# Patient Record
Sex: Male | Born: 2000 | Race: White | Hispanic: No | Marital: Single | Smoking: Never smoker
Health system: Southern US, Community
[De-identification: ages and names within clinical notes are randomized; demographics above are authoritative.]

## PROBLEM LIST (undated history)

## (undated) DIAGNOSIS — J45909 Unspecified asthma, uncomplicated: Secondary | ICD-10-CM

## (undated) DIAGNOSIS — F419 Anxiety disorder, unspecified: Secondary | ICD-10-CM

## (undated) HISTORY — DX: Unspecified asthma, uncomplicated: J45.909

## (undated) HISTORY — PX: TONSILECTOMY, ADENOIDECTOMY, BILATERAL MYRINGOTOMY AND TUBES: SHX2538

## (undated) HISTORY — DX: Anxiety disorder, unspecified: F41.9

---
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
## 2021-01-11 ENCOUNTER — Ambulatory Visit (INDEPENDENT_AMBULATORY_CARE_PROVIDER_SITE_OTHER): Payer: BC Managed Care – PPO | Admitting: Psychology

## 2021-01-11 DIAGNOSIS — F419 Anxiety disorder, unspecified: Secondary | ICD-10-CM | POA: Diagnosis not present

## 2021-01-11 DIAGNOSIS — F4310 Post-traumatic stress disorder, unspecified: Secondary | ICD-10-CM

## 2021-01-11 DIAGNOSIS — F429 Obsessive-compulsive disorder, unspecified: Secondary | ICD-10-CM

## 2021-01-11 DIAGNOSIS — F331 Major depressive disorder, recurrent, moderate: Secondary | ICD-10-CM

## 2021-01-24 ENCOUNTER — Ambulatory Visit (INDEPENDENT_AMBULATORY_CARE_PROVIDER_SITE_OTHER): Payer: BC Managed Care – PPO | Admitting: Psychology

## 2021-01-24 DIAGNOSIS — F331 Major depressive disorder, recurrent, moderate: Secondary | ICD-10-CM | POA: Diagnosis not present

## 2021-01-24 DIAGNOSIS — F419 Anxiety disorder, unspecified: Secondary | ICD-10-CM

## 2021-01-25 DIAGNOSIS — S43004A Unspecified dislocation of right shoulder joint, initial encounter: Secondary | ICD-10-CM | POA: Diagnosis not present

## 2021-01-25 DIAGNOSIS — G8918 Other acute postprocedural pain: Secondary | ICD-10-CM | POA: Diagnosis not present

## 2021-01-25 DIAGNOSIS — S43021A Posterior subluxation of right humerus, initial encounter: Secondary | ICD-10-CM | POA: Diagnosis not present

## 2021-01-25 DIAGNOSIS — M24011 Loose body in right shoulder: Secondary | ICD-10-CM | POA: Diagnosis not present

## 2021-01-25 DIAGNOSIS — Y9365 Activity, lacrosse and field hockey: Secondary | ICD-10-CM | POA: Diagnosis not present

## 2021-01-25 DIAGNOSIS — X58XXXA Exposure to other specified factors, initial encounter: Secondary | ICD-10-CM | POA: Diagnosis not present

## 2021-01-25 DIAGNOSIS — M25311 Other instability, right shoulder: Secondary | ICD-10-CM | POA: Diagnosis not present

## 2021-02-02 DIAGNOSIS — M24011 Loose body in right shoulder: Secondary | ICD-10-CM | POA: Diagnosis not present

## 2021-02-15 ENCOUNTER — Ambulatory Visit (INDEPENDENT_AMBULATORY_CARE_PROVIDER_SITE_OTHER): Payer: BC Managed Care – PPO | Admitting: Psychology

## 2021-02-15 DIAGNOSIS — F331 Major depressive disorder, recurrent, moderate: Secondary | ICD-10-CM | POA: Diagnosis not present

## 2021-02-15 DIAGNOSIS — F419 Anxiety disorder, unspecified: Secondary | ICD-10-CM

## 2021-02-21 ENCOUNTER — Ambulatory Visit (INDEPENDENT_AMBULATORY_CARE_PROVIDER_SITE_OTHER): Payer: BC Managed Care – PPO | Admitting: Psychology

## 2021-02-21 DIAGNOSIS — F331 Major depressive disorder, recurrent, moderate: Secondary | ICD-10-CM

## 2021-02-21 DIAGNOSIS — F419 Anxiety disorder, unspecified: Secondary | ICD-10-CM | POA: Diagnosis not present

## 2021-02-23 DIAGNOSIS — M25611 Stiffness of right shoulder, not elsewhere classified: Secondary | ICD-10-CM | POA: Diagnosis not present

## 2021-02-23 DIAGNOSIS — S43004D Unspecified dislocation of right shoulder joint, subsequent encounter: Secondary | ICD-10-CM | POA: Diagnosis not present

## 2021-02-23 DIAGNOSIS — M25511 Pain in right shoulder: Secondary | ICD-10-CM | POA: Diagnosis not present

## 2021-02-23 DIAGNOSIS — M6281 Muscle weakness (generalized): Secondary | ICD-10-CM | POA: Diagnosis not present

## 2021-03-01 ENCOUNTER — Ambulatory Visit (INDEPENDENT_AMBULATORY_CARE_PROVIDER_SITE_OTHER): Payer: BC Managed Care – PPO | Admitting: Psychology

## 2021-03-01 DIAGNOSIS — M25611 Stiffness of right shoulder, not elsewhere classified: Secondary | ICD-10-CM | POA: Diagnosis not present

## 2021-03-01 DIAGNOSIS — F419 Anxiety disorder, unspecified: Secondary | ICD-10-CM

## 2021-03-01 DIAGNOSIS — F331 Major depressive disorder, recurrent, moderate: Secondary | ICD-10-CM | POA: Diagnosis not present

## 2021-03-01 DIAGNOSIS — M25511 Pain in right shoulder: Secondary | ICD-10-CM | POA: Diagnosis not present

## 2021-03-01 DIAGNOSIS — M6281 Muscle weakness (generalized): Secondary | ICD-10-CM | POA: Diagnosis not present

## 2021-03-01 DIAGNOSIS — S43004D Unspecified dislocation of right shoulder joint, subsequent encounter: Secondary | ICD-10-CM | POA: Diagnosis not present

## 2021-03-07 ENCOUNTER — Ambulatory Visit (INDEPENDENT_AMBULATORY_CARE_PROVIDER_SITE_OTHER): Payer: BC Managed Care – PPO | Admitting: Psychology

## 2021-03-07 DIAGNOSIS — F419 Anxiety disorder, unspecified: Secondary | ICD-10-CM

## 2021-03-07 DIAGNOSIS — F331 Major depressive disorder, recurrent, moderate: Secondary | ICD-10-CM

## 2021-03-08 DIAGNOSIS — M25611 Stiffness of right shoulder, not elsewhere classified: Secondary | ICD-10-CM | POA: Diagnosis not present

## 2021-03-08 DIAGNOSIS — S43004D Unspecified dislocation of right shoulder joint, subsequent encounter: Secondary | ICD-10-CM | POA: Diagnosis not present

## 2021-03-08 DIAGNOSIS — M25511 Pain in right shoulder: Secondary | ICD-10-CM | POA: Diagnosis not present

## 2021-03-08 DIAGNOSIS — M6281 Muscle weakness (generalized): Secondary | ICD-10-CM | POA: Diagnosis not present

## 2021-03-14 DIAGNOSIS — M6281 Muscle weakness (generalized): Secondary | ICD-10-CM | POA: Diagnosis not present

## 2021-03-14 DIAGNOSIS — M25611 Stiffness of right shoulder, not elsewhere classified: Secondary | ICD-10-CM | POA: Diagnosis not present

## 2021-03-14 DIAGNOSIS — S43004D Unspecified dislocation of right shoulder joint, subsequent encounter: Secondary | ICD-10-CM | POA: Diagnosis not present

## 2021-03-14 DIAGNOSIS — M25511 Pain in right shoulder: Secondary | ICD-10-CM | POA: Diagnosis not present

## 2021-03-16 ENCOUNTER — Ambulatory Visit: Payer: BC Managed Care – PPO | Admitting: Psychology

## 2021-03-20 ENCOUNTER — Ambulatory Visit: Payer: BC Managed Care – PPO | Admitting: Psychology

## 2021-03-21 DIAGNOSIS — M25511 Pain in right shoulder: Secondary | ICD-10-CM | POA: Diagnosis not present

## 2021-03-21 DIAGNOSIS — M6281 Muscle weakness (generalized): Secondary | ICD-10-CM | POA: Diagnosis not present

## 2021-03-21 DIAGNOSIS — M25611 Stiffness of right shoulder, not elsewhere classified: Secondary | ICD-10-CM | POA: Diagnosis not present

## 2021-03-21 DIAGNOSIS — S43004D Unspecified dislocation of right shoulder joint, subsequent encounter: Secondary | ICD-10-CM | POA: Diagnosis not present

## 2021-03-24 DIAGNOSIS — M25611 Stiffness of right shoulder, not elsewhere classified: Secondary | ICD-10-CM | POA: Diagnosis not present

## 2021-03-24 DIAGNOSIS — S43004D Unspecified dislocation of right shoulder joint, subsequent encounter: Secondary | ICD-10-CM | POA: Diagnosis not present

## 2021-03-24 DIAGNOSIS — M25511 Pain in right shoulder: Secondary | ICD-10-CM | POA: Diagnosis not present

## 2021-03-24 DIAGNOSIS — M6281 Muscle weakness (generalized): Secondary | ICD-10-CM | POA: Diagnosis not present

## 2021-03-29 DIAGNOSIS — M25511 Pain in right shoulder: Secondary | ICD-10-CM | POA: Diagnosis not present

## 2021-03-29 DIAGNOSIS — M25611 Stiffness of right shoulder, not elsewhere classified: Secondary | ICD-10-CM | POA: Diagnosis not present

## 2021-03-29 DIAGNOSIS — S43004D Unspecified dislocation of right shoulder joint, subsequent encounter: Secondary | ICD-10-CM | POA: Diagnosis not present

## 2021-03-29 DIAGNOSIS — M6281 Muscle weakness (generalized): Secondary | ICD-10-CM | POA: Diagnosis not present

## 2021-03-31 DIAGNOSIS — M6281 Muscle weakness (generalized): Secondary | ICD-10-CM | POA: Diagnosis not present

## 2021-03-31 DIAGNOSIS — S43004D Unspecified dislocation of right shoulder joint, subsequent encounter: Secondary | ICD-10-CM | POA: Diagnosis not present

## 2021-03-31 DIAGNOSIS — M25511 Pain in right shoulder: Secondary | ICD-10-CM | POA: Diagnosis not present

## 2021-03-31 DIAGNOSIS — M25611 Stiffness of right shoulder, not elsewhere classified: Secondary | ICD-10-CM | POA: Diagnosis not present

## 2021-04-04 DIAGNOSIS — S43004D Unspecified dislocation of right shoulder joint, subsequent encounter: Secondary | ICD-10-CM | POA: Diagnosis not present

## 2021-04-04 DIAGNOSIS — M6281 Muscle weakness (generalized): Secondary | ICD-10-CM | POA: Diagnosis not present

## 2021-04-04 DIAGNOSIS — M25511 Pain in right shoulder: Secondary | ICD-10-CM | POA: Diagnosis not present

## 2021-04-04 DIAGNOSIS — M25611 Stiffness of right shoulder, not elsewhere classified: Secondary | ICD-10-CM | POA: Diagnosis not present

## 2021-04-07 DIAGNOSIS — M25611 Stiffness of right shoulder, not elsewhere classified: Secondary | ICD-10-CM | POA: Diagnosis not present

## 2021-04-07 DIAGNOSIS — S43004D Unspecified dislocation of right shoulder joint, subsequent encounter: Secondary | ICD-10-CM | POA: Diagnosis not present

## 2021-04-07 DIAGNOSIS — M25511 Pain in right shoulder: Secondary | ICD-10-CM | POA: Diagnosis not present

## 2021-04-07 DIAGNOSIS — M6281 Muscle weakness (generalized): Secondary | ICD-10-CM | POA: Diagnosis not present

## 2021-04-14 DIAGNOSIS — S43004D Unspecified dislocation of right shoulder joint, subsequent encounter: Secondary | ICD-10-CM | POA: Diagnosis not present

## 2021-04-14 DIAGNOSIS — M25511 Pain in right shoulder: Secondary | ICD-10-CM | POA: Diagnosis not present

## 2021-04-14 DIAGNOSIS — M6281 Muscle weakness (generalized): Secondary | ICD-10-CM | POA: Diagnosis not present

## 2021-04-14 DIAGNOSIS — M25611 Stiffness of right shoulder, not elsewhere classified: Secondary | ICD-10-CM | POA: Diagnosis not present

## 2021-04-18 DIAGNOSIS — M25511 Pain in right shoulder: Secondary | ICD-10-CM | POA: Diagnosis not present

## 2021-04-18 DIAGNOSIS — S43004D Unspecified dislocation of right shoulder joint, subsequent encounter: Secondary | ICD-10-CM | POA: Diagnosis not present

## 2021-04-18 DIAGNOSIS — M6281 Muscle weakness (generalized): Secondary | ICD-10-CM | POA: Diagnosis not present

## 2021-04-18 DIAGNOSIS — M25611 Stiffness of right shoulder, not elsewhere classified: Secondary | ICD-10-CM | POA: Diagnosis not present

## 2021-05-09 DIAGNOSIS — L700 Acne vulgaris: Secondary | ICD-10-CM | POA: Diagnosis not present

## 2021-05-17 ENCOUNTER — Ambulatory Visit (INDEPENDENT_AMBULATORY_CARE_PROVIDER_SITE_OTHER): Payer: BC Managed Care – PPO | Admitting: Psychology

## 2021-05-17 DIAGNOSIS — F419 Anxiety disorder, unspecified: Secondary | ICD-10-CM | POA: Diagnosis not present

## 2021-05-17 DIAGNOSIS — F331 Major depressive disorder, recurrent, moderate: Secondary | ICD-10-CM

## 2021-05-30 DIAGNOSIS — S43491A Other sprain of right shoulder joint, initial encounter: Secondary | ICD-10-CM | POA: Diagnosis not present

## 2021-06-06 ENCOUNTER — Ambulatory Visit (INDEPENDENT_AMBULATORY_CARE_PROVIDER_SITE_OTHER): Payer: BC Managed Care – PPO | Admitting: Psychology

## 2021-06-06 DIAGNOSIS — F419 Anxiety disorder, unspecified: Secondary | ICD-10-CM

## 2021-06-06 DIAGNOSIS — F331 Major depressive disorder, recurrent, moderate: Secondary | ICD-10-CM | POA: Diagnosis not present

## 2021-06-13 ENCOUNTER — Ambulatory Visit (INDEPENDENT_AMBULATORY_CARE_PROVIDER_SITE_OTHER): Payer: BC Managed Care – PPO | Admitting: Psychology

## 2021-06-13 DIAGNOSIS — F419 Anxiety disorder, unspecified: Secondary | ICD-10-CM | POA: Diagnosis not present

## 2021-06-13 DIAGNOSIS — F331 Major depressive disorder, recurrent, moderate: Secondary | ICD-10-CM | POA: Diagnosis not present

## 2021-06-20 ENCOUNTER — Ambulatory Visit: Payer: BC Managed Care – PPO | Admitting: Psychology

## 2021-06-28 ENCOUNTER — Ambulatory Visit: Payer: BC Managed Care – PPO | Admitting: Adult Health

## 2021-06-29 ENCOUNTER — Other Ambulatory Visit: Payer: Self-pay

## 2021-06-29 ENCOUNTER — Encounter: Payer: Self-pay | Admitting: Adult Health

## 2021-06-29 ENCOUNTER — Ambulatory Visit (INDEPENDENT_AMBULATORY_CARE_PROVIDER_SITE_OTHER): Payer: BC Managed Care – PPO | Admitting: Adult Health

## 2021-06-29 DIAGNOSIS — F422 Mixed obsessional thoughts and acts: Secondary | ICD-10-CM

## 2021-06-29 DIAGNOSIS — F4311 Post-traumatic stress disorder, acute: Secondary | ICD-10-CM

## 2021-06-29 DIAGNOSIS — F900 Attention-deficit hyperactivity disorder, predominantly inattentive type: Secondary | ICD-10-CM

## 2021-06-29 DIAGNOSIS — F411 Generalized anxiety disorder: Secondary | ICD-10-CM | POA: Diagnosis not present

## 2021-06-29 MED ORDER — BUPROPION HCL ER (XL) 150 MG PO TB24: 150.0000 mg | ORAL_TABLET | Freq: Every day | ORAL | 3 refills | Status: DC

## 2021-06-29 MED ORDER — SERTRALINE HCL 100 MG PO TABS: ORAL_TABLET | ORAL | 3 refills | Status: DC

## 2021-06-29 MED ORDER — CLONIDINE HCL 0.1 MG PO TABS: 0.1000 mg | ORAL_TABLET | Freq: Every day | ORAL | 3 refills | Status: DC

## 2021-06-29 NOTE — Progress Notes (Signed)
Johnny Hubbard [id number] [birth year] 20 y.o.  Subjective:   Patient ID:  Johnny Hubbard is a 20 y.o. (DOB July 05, 2001) male.  Chief Complaint: No chief complaint on file.   HPI Johnny Hubbard presents to the office today for follow-up of GAD, ADHD, PTSD, Obsessional thoughts and acts.  Describes mood today as "ok". Pleasant. Denies tearfulness. Mood symptoms - reports decreased depression, anxiety, and irritability. Stating "I think I'm doing good". Denies worry and rumination. Decreased obsessive thoughts. Feels like medications continue to work well. Seeing Dr. Dellia Cloud for therapy. Stable interest and motivation. Taking medications as prescribed. MRI scheduled for right shoulder. Energy levels stable. Active, has a regular exercise routine. Playing Lacrosse. Enjoys some usual interests and activities. Freshman at [employer]. Parents divorced. Staying at mothers full time and spends time with father. Has a brother at [facility]. Spending time with family. Appetite adequate. Weight stable. Sleeps well most nights. Averages 7 to 8 hours. Focus and concentration stable. Completing tasks. Managing aspects of household. Starting [employer] in January. Denies SI or HI.  Denies AH or VH.  Previous medication trials: Denies      Review of Systems:  Review of Systems  Musculoskeletal:  Negative for gait problem.  Neurological:  Negative for tremors.  Psychiatric/Behavioral:         Please refer to HPI   Medications: I have reviewed the patient's current medications.  Current Outpatient Medications  Medication Sig Dispense Refill   buPROPion (WELLBUTRIN XL) 150 MG 24 hr tablet Take 1 tablet (150 mg total) by mouth daily after breakfast. 90 tablet 3   cloNIDine (CATAPRES) 0.1 MG tablet Take 1 tablet (0.1 mg total) by mouth at bedtime. 90 tablet 3   penicillin v potassium (VEETID) 500 MG tablet Take 1 tablet (500 mg total) by mouth 2 (two) times daily. 20 tablet 0   sertraline  (ZOLOFT) 100 MG tablet Take one and 1/2 tablets daily. 135 tablet 3   No current facility-administered medications for this visit.    Medication Side Effects: None  Allergies: No Known Allergies  Past Medical History:  Diagnosis Date   Anxiety    Asthma     Past Medical History, Surgical history, Social history, and Family history were reviewed and updated as appropriate.   Please see review of systems for further details on the patient's review from today.   Objective:   Physical Exam:  There were no vitals taken for this visit.  Physical Exam Constitutional:      General: He is not in acute distress. Musculoskeletal:        General: No deformity.  Neurological:     Mental Status: He is alert and oriented to person, place, and time.     Coordination: Coordination normal.  Psychiatric:        Attention and Perception: Attention and perception normal. He does not perceive auditory or visual hallucinations.        Mood and Affect: Mood normal. Mood is not anxious or depressed. Affect is not labile, blunt, angry or inappropriate.        Speech: Speech normal.        Behavior: Behavior normal.        Thought Content: Thought content normal. Thought content is not paranoid or delusional. Thought content does not include homicidal or suicidal ideation. Thought content does not include homicidal or suicidal plan.        Cognition and Memory: Cognition and memory normal.  Judgment: Judgment normal.     Comments: Insight intact    Lab Review:  No results found for: NA, K, CL, CO2, GLUCOSE, BUN, CREATININE, CALCIUM, PROT, ALBUMIN, AST, ALT, ALKPHOS, BILITOT, GFRNONAA, GFRAA  No results found for: WBC, RBC, HGB, HCT, PLT, MCV, MCH, MCHC, RDW, LYMPHSABS, MONOABS, EOSABS, BASOSABS  No results found for: POCLITH, LITHIUM   No results found for: PHENYTOIN, PHENOBARB, VALPROATE, CBMZ   .res Assessment: Plan:     Plan:  PDMP reviewed  1. Zoloft 100 mg every morning  after breakfast  2. Wellbutrin 150 mg XL every morning after breakfast  3. Clonidine 0.1 mg every bedtime   Read and reviewed note with patient for accuracy.   RTC 6 months  Patient advised to contact office with any questions, adverse effects, or acute worsening in signs and symptoms.   Diagnoses and all orders for this visit:  Mixed obsessional thoughts and acts  Attention deficit hyperactivity disorder (ADHD), inattentive type, mild -     buPROPion (WELLBUTRIN XL) 150 MG 24 hr tablet; Take 1 tablet (150 mg total) by mouth daily after breakfast. -     cloNIDine (CATAPRES) 0.1 MG tablet; Take 1 tablet (0.1 mg total) by mouth at bedtime.  Generalized anxiety disorder -     cloNIDine (CATAPRES) 0.1 MG tablet; Take 1 tablet (0.1 mg total) by mouth at bedtime. -     sertraline (ZOLOFT) 100 MG tablet; Take one and 1/2 tablets daily.  Post-traumatic stress disorder, acute    Please see After Visit Summary for patient specific instructions.  Future Appointments  Date Time Provider Department Center  12/04/2021  8:40 AM Emmerie Battaglia, Thereasa Solo, NP CP-CP None    No orders of the defined types were placed in this encounter.   -------------------------------

## 2021-07-04 DIAGNOSIS — Z0000 Encounter for general adult medical examination without abnormal findings: Secondary | ICD-10-CM | POA: Diagnosis not present

## 2021-07-04 DIAGNOSIS — L709 Acne, unspecified: Secondary | ICD-10-CM | POA: Diagnosis not present

## 2021-09-19 ENCOUNTER — Ambulatory Visit (INDEPENDENT_AMBULATORY_CARE_PROVIDER_SITE_OTHER): Payer: BC Managed Care – PPO | Admitting: Psychology

## 2021-09-19 DIAGNOSIS — F411 Generalized anxiety disorder: Secondary | ICD-10-CM | POA: Diagnosis not present

## 2021-09-19 DIAGNOSIS — F422 Mixed obsessional thoughts and acts: Secondary | ICD-10-CM

## 2021-09-19 NOTE — Progress Notes (Signed)
Progress Note Start: 06/13/2021 09:38 AM End: 06/13/2021 10:23 AM Diagnosis F42 (Hoarding disorder) [n/a]  F43.21 (Adjustment Disorder, With depressed mood) [n/a]  Symptoms Demonstrates low energy. (Status: maintained) -- No Description Entered  Lacks interest in previously enjoyed activities. (Status: maintained) -- No Description Entered  Recognition that obsessive thoughts are a product of his/her own mind. (Status: maintained) -- No Description Entered  Recurrent and persistent ideas, thoughts, or impulses that are viewed as intrusive, senseless, and time-consuming, or that interfere with the client's daily routine, school performance, or social relationships. (Status: maintained) -- No Description Entered  Medication Status na  Safety unspecified If Suicidal or Homicidal State Action Taken: unspecified  Current Risk:  Medications Bupropion (Dosage: 150mg )  Clonidine (Dosage: .1mg )  Sertraline (Dosage: 100mg )  Objectives Related Problem: Elevate mood and show evidence of usual energy, activities, and socialization level. Description: Specify what in the past contributes to current sadness. Target Date: 2022-01-09 Frequency: Daily Modality: individual Progress: 80%  Related Problem: Elevate mood and show evidence of usual energy, activities, and socialization level. Description: Learn new ways to overcome depression through activity. Target Date: 2022-01-08 Frequency: Daily Modality: individual Progress: 70%  Related Problem: Elevate mood and show evidence of usual energy, activities, and socialization level. Description: Verbalize any history of suicide attempts and any current suicidal urges. Target Date: 2021-03-10 Frequency: Daily Modality: individual Progress: 100%  Related Problem: Elevate mood and show evidence of usual energy, activities, and socialization level. Description: Verbally identify, if possible, the source of depressed mood. Target Date:  2022-01-08 Frequency: Daily Modality: individual Progress: 80%  Related Problem: Elevate mood and show evidence of usual energy, activities, and socialization level. Description: Describe current and past experiences with depression, complete with its impact on functioning and attempts to resolve it. Target Date: 2022-01-09 Frequency: Daily Modality: individual Progress: 70%  Related Problem: Let go of key thoughts, beliefs, and past life events in order to maximize time free from obsessions and compulsions. Description: Identify support persons or resources that can help the client manage obsessions/compulsions. Target Date: 2022-01-08 Frequency: Daily Modality: individual Progress: 70%  Related Problem: Let go of key thoughts, beliefs, and past life events in order to maximize time free from obsessions and compulsions. Description: Describe the nature, history, and severity of obsessive thoughts and/or compulsive behavior. Target Date: 2022-01-09 Frequency: Daily Modality: individual Progress: 60%  Client Response full compliance  Service Location Location, [address] [place], [address]22[address] [place]  Service Code cpt [id number]  Facilitate problem solving  Review therapy homework  Session notes:  Dx. Depression and Anxiety. Also, OCD tendencies and some PTSD  Meds: Clonidine (.1 mg at bed time), Sertraline(100mg ) and Bupropion 150mg /day (Dr. [place]) Goals: Theophile states he is seeking to reduce his symptoms of anxiety and stress. In particular, seeking to reduce his chronic worry. Goal dat revised is 6-23.  Patient agrees to to a video (Webex session) session due to the [facility] Virus. He is at home and I am at my home office.  Shihab says he is now at [facility]. He says he stopped his meds for a while and he felt he was zoning out at class. Went back on it and felt "weird" (loss of appetite, no energy). He stopped again, but is now struggling. Told him he needs to be re-evaluated by  Dr. at [place]. He says he is emotionally doing well. He is dating another person and says it is going well. His ex-girlfriend did apologize to him. When he  found out she was dating another person, he says he had no reaction. This was indicator to him that he has improved. He really feels he has been able to get past his upset over his former relationship. He is playing [employer]. He says there were several suicides at school, one of which was in his dorm. He did not know any of them, but it upset his mother. She worries about both her boys "because of what she went through". He states that leaving home was easier than he anticipated. He thought it would be much more difficult. He says he is not doing quite as well academically as he did last semester. He will call his psychiatrist tomorrow. He will meet with me again in mid-March. Garrel Ridgel, PhD Time: 4:10p-5:00p 50 minutes.

## 2021-10-11 ENCOUNTER — Ambulatory Visit: Payer: BC Managed Care – PPO | Admitting: Psychology

## 2021-11-16 DIAGNOSIS — Z111 Encounter for screening for respiratory tuberculosis: Secondary | ICD-10-CM | POA: Diagnosis not present

## 2021-11-24 DIAGNOSIS — Z23 Encounter for immunization: Secondary | ICD-10-CM | POA: Diagnosis not present

## 2021-12-04 ENCOUNTER — Ambulatory Visit: Payer: BC Managed Care – PPO | Admitting: Adult Health

## 2021-12-13 ENCOUNTER — Telehealth: Payer: BC Managed Care – PPO | Admitting: Adult Health

## 2022-01-05 ENCOUNTER — Encounter: Payer: Self-pay | Admitting: Adult Health

## 2022-01-05 ENCOUNTER — Ambulatory Visit (INDEPENDENT_AMBULATORY_CARE_PROVIDER_SITE_OTHER): Payer: BC Managed Care – PPO | Admitting: Adult Health

## 2022-01-05 DIAGNOSIS — F411 Generalized anxiety disorder: Secondary | ICD-10-CM | POA: Diagnosis not present

## 2022-01-05 DIAGNOSIS — F900 Attention-deficit hyperactivity disorder, predominantly inattentive type: Secondary | ICD-10-CM

## 2022-01-05 DIAGNOSIS — F4311 Post-traumatic stress disorder, acute: Secondary | ICD-10-CM | POA: Diagnosis not present

## 2022-01-05 DIAGNOSIS — F422 Mixed obsessional thoughts and acts: Secondary | ICD-10-CM | POA: Diagnosis not present

## 2022-01-05 NOTE — Progress Notes (Signed)
DEMARLO RIOJAS [id number] [birth year] 20 y.o.  Virtual Visit via Telephone Note  I connected with pt on 01/05/22 at  8:20 AM EDT by telephone and verified that I am speaking with the correct person using two identifiers.   I discussed the limitations, risks, security and privacy concerns of performing an evaluation and management service by telephone and the availability of in person appointments. I also discussed with the patient that there may be a patient responsible charge related to this service. The patient expressed understanding and agreed to proceed.   I discussed the assessment and treatment plan with the patient. The patient was provided an opportunity to ask questions and all were answered. The patient agreed with the plan and demonstrated an understanding of the instructions.   The patient was advised to call back or seek an in-person evaluation if the symptoms worsen or if the condition fails to improve as anticipated.  I provided 25 minutes of non-face-to-face time during this encounter.  The patient was located at home.  The provider was located at [facility].   Dorothyann Gibbs, NP   Subjective:   Patient ID:  BYRAN BILOTTI is a 21 y.o. (DOB 04/17/2001) male.  Chief Complaint: No chief complaint on file.   HPI Terre JERMON CHALFANT presents for follow-up of GAD, ADHD, PTSD, Obsessional thoughts and acts.  Describes mood today as "ok". Pleasant. Denies tearfulness. Mood symptoms - denies depression, anxiety, and irritability. Denies worry and rumination. Decreased obsessive thoughts. Stating "I'm doing alright". Feels like medications continue to work well. Seeing Dr. Dellia Cloud for therapy - check ins.. Stable interest and motivation. Taking medications as prescribed.  Energy levels stable. Active, has a regular exercise routine. Playing Lacrosse. Enjoys some usual interests and activities. In a relationship - 5 to 6 months. Parents divorced. Has a brother  at [facility]. Spending time with family. Appetite adequate. Weight stable. Sleeps well most nights. Averages 7 to 8 hours. Focus and concentration stable. Completing tasks. Managing aspects of household. Attends [employer]. Denies SI or HI.  Denies AH or VH. Denies self harm. Denies substance use.   Previous medication trials: Denies   Review of Systems:  Review of Systems  Musculoskeletal:  Negative for gait problem.  Neurological:  Negative for tremors.  Psychiatric/Behavioral:         Please refer to HPI    Medications: I have reviewed the patient's current medications.  Current Outpatient Medications  Medication Sig Dispense Refill   buPROPion (WELLBUTRIN XL) 150 MG 24 hr tablet Take 1 tablet (150 mg total) by mouth daily after breakfast. 90 tablet 3   cloNIDine (CATAPRES) 0.1 MG tablet Take 1 tablet (0.1 mg total) by mouth at bedtime. 90 tablet 3   penicillin v potassium (VEETID) 500 MG tablet Take 1 tablet (500 mg total) by mouth 2 (two) times daily. 20 tablet 0   sertraline (ZOLOFT) 100 MG tablet Take one and 1/2 tablets daily. 135 tablet 3   No current facility-administered medications for this visit.    Medication Side Effects: None  Allergies: No Known Allergies  Past Medical History:  Diagnosis Date   Anxiety    Asthma     No family history on file.  Social History   Socioeconomic History   Marital status: Single    Spouse name: Not on file   Number of children: Not on file   Years of education: Not on file   Highest education level: Not on file  Occupational  History   Not on file  Tobacco Use   Smoking status: Never   Smokeless tobacco: Never  Vaping Use   Vaping Use: Never used  Substance and Sexual Activity   Alcohol use: Never    Alcohol/week: 0.0 standard drinks of alcohol   Drug use: Never   Sexual activity: Never  Other Topics Concern   Not on file  Social History Narrative   Not on file   Social Determinants of Health   Financial  Resource Strain: Not on file  Food Insecurity: Not on file  Transportation Needs: Not on file  Physical Activity: Not on file  Stress: Not on file  Social Connections: Not on file  Intimate Partner Violence: Not on file    Past Medical History, Surgical history, Social history, and Family history were reviewed and updated as appropriate.   Please see review of systems for further details on the patient's review from today.   Objective:   Physical Exam:  There were no vitals taken for this visit.  Physical Exam Constitutional:      General: He is not in acute distress. Musculoskeletal:        General: No deformity.  Neurological:     Mental Status: He is alert and oriented to person, place, and time.     Coordination: Coordination normal.  Psychiatric:        Attention and Perception: Attention and perception normal. He does not perceive auditory or visual hallucinations.        Mood and Affect: Mood normal. Mood is not anxious or depressed. Affect is not labile, blunt, angry or inappropriate.        Speech: Speech normal.        Behavior: Behavior normal.        Thought Content: Thought content normal. Thought content is not paranoid or delusional. Thought content does not include homicidal or suicidal ideation. Thought content does not include homicidal or suicidal plan.        Cognition and Memory: Cognition and memory normal.        Judgment: Judgment normal.     Comments: Insight intact     Lab Review:  No results found for: "NA", "K", "CL", "CO2", "GLUCOSE", "BUN", "CREATININE", "CALCIUM", "PROT", "ALBUMIN", "AST", "ALT", "ALKPHOS", "BILITOT", "GFRNONAA", "GFRAA"  No results found for: "WBC", "RBC", "HGB", "HCT", "PLT", "MCV", "MCH", "MCHC", "RDW", "LYMPHSABS", "MONOABS", "EOSABS", "BASOSABS"  No results found for: "POCLITH", "LITHIUM"   No results found for: "PHENYTOIN", "PHENOBARB", "VALPROATE", "CBMZ"   .res Assessment: Plan:    Plan:  PDMP reviewed  1.  Zoloft 100 mg every morning after breakfast  2. Wellbutrin 150 mg XL every morning after breakfast  3. Clonidine 0.1 mg every bedtime   Read and reviewed note with patient for accuracy.   RTC 6 months  Patient advised to contact office with any questions, adverse effects, or acute worsening in signs and symptoms.  Diagnoses and all orders for this visit:  Generalized anxiety disorder  Mixed obsessional thoughts and acts  Attention deficit hyperactivity disorder (ADHD), inattentive type, mild  Post-traumatic stress disorder, acute    Please see After Visit Summary for patient specific instructions.  No future appointments.  No orders of the defined types were placed in this encounter.     -------------------------------

## 2022-01-26 ENCOUNTER — Encounter: Payer: Self-pay | Admitting: Adult Health

## 2022-01-26 ENCOUNTER — Ambulatory Visit (INDEPENDENT_AMBULATORY_CARE_PROVIDER_SITE_OTHER): Payer: BC Managed Care – PPO | Admitting: Adult Health

## 2022-01-26 DIAGNOSIS — F411 Generalized anxiety disorder: Secondary | ICD-10-CM

## 2022-01-26 DIAGNOSIS — F4311 Post-traumatic stress disorder, acute: Secondary | ICD-10-CM

## 2022-01-26 DIAGNOSIS — F422 Mixed obsessional thoughts and acts: Secondary | ICD-10-CM

## 2022-01-26 DIAGNOSIS — F900 Attention-deficit hyperactivity disorder, predominantly inattentive type: Secondary | ICD-10-CM

## 2022-01-26 MED ORDER — AMPHETAMINE-DEXTROAMPHETAMINE 20 MG PO TABS: 20.0000 mg | ORAL_TABLET | Freq: Every day | ORAL | 0 refills | Status: DC

## 2022-01-26 NOTE — Progress Notes (Signed)
Johnny Hubbard [id number] 2000/09/18 21 y.o.  Subjective:   Patient ID:  Johnny Hubbard is a 21 y.o. (DOB [birth year]) male.  Chief Complaint: No chief complaint on file.   HPI Johnny Hubbard presents to the office today for follow-up of GAD, ADHD, PTSD, Obsessional thoughts and acts.  Describes mood today as "ok". Pleasant. Denies tearfulness. Mood symptoms - denies depression, anxiety, and irritability. Denies worry and rumination. Decreased obsessive thoughts. Stating "I'm doing alright". Reports increased issues with focus and concentration - previously diagnosed with ADD. Would like to explore options for managing symptoms - taking summer school classes - calculus. Seeing Dr. Dellia Hubbard for therapy as needed. Stable interest and motivation. Taking medications as prescribed.  Energy levels stable. Active, has a regular exercise routine. Playing Lacrosse. Enjoys some usual interests and activities. In a relationship - 5 to 6 months. Parents divorced. Lives with mother. Has a brother at [facility]. Spending time with family. Appetite adequate. Weight stable. Sleeps well most nights. Averages 7 to 8 hours. Focus and concentration difficulties - previous diagnosis of ADHD. Completing tasks. Managing aspects of household. Attends [employer]. Denies SI or HI.  Denies AH or VH. Denies self harm. Denies substance use.   Previous medication trials: Denies       Review of Systems:  Review of Systems  Musculoskeletal:  Negative for gait problem.  Neurological:  Negative for tremors.  Psychiatric/Behavioral:         Please refer to HPI    Medications: I have reviewed the patient's current medications.  Current Outpatient Medications  Medication Sig Dispense Refill   amphetamine-dextroamphetamine (ADDERALL) 20 MG tablet Take 1 tablet (20 mg total) by mouth daily. 30 tablet 0   buPROPion (WELLBUTRIN XL) 150 MG 24 hr tablet Take 1 tablet (150 mg total) by mouth daily after  breakfast. 90 tablet 3   cloNIDine (CATAPRES) 0.1 MG tablet Take 1 tablet (0.1 mg total) by mouth at bedtime. 90 tablet 3   penicillin v potassium (VEETID) 500 MG tablet Take 1 tablet (500 mg total) by mouth 2 (two) times daily. 20 tablet 0   sertraline (ZOLOFT) 100 MG tablet Take one and 1/2 tablets daily. 135 tablet 3   No current facility-administered medications for this visit.    Medication Side Effects: None  Allergies: No Known Allergies  Past Medical History:  Diagnosis Date   Anxiety    Asthma     Past Medical History, Surgical history, Social history, and Family history were reviewed and updated as appropriate.   Please see review of systems for further details on the patient's review from today.   Objective:   Physical Exam:  There were no vitals taken for this visit.  Physical Exam Constitutional:      General: He is not in acute distress. Musculoskeletal:        General: No deformity.  Neurological:     Mental Status: He is alert and oriented to person, place, and time.     Coordination: Coordination normal.  Psychiatric:        Attention and Perception: Attention and perception normal. He does not perceive auditory or visual hallucinations.        Mood and Affect: Mood normal. Mood is not anxious or depressed. Affect is not labile, blunt, angry or inappropriate.        Speech: Speech normal.        Behavior: Behavior normal.        Thought Content: Thought content  normal. Thought content is not paranoid or delusional. Thought content does not include homicidal or suicidal ideation. Thought content does not include homicidal or suicidal plan.        Cognition and Memory: Cognition and memory normal.        Judgment: Judgment normal.     Comments: Insight intact     Lab Review:  No results found for: "NA", "K", "CL", "CO2", "GLUCOSE", "BUN", "CREATININE", "CALCIUM", "PROT", "ALBUMIN", "AST", "ALT", "ALKPHOS", "BILITOT", "GFRNONAA", "GFRAA"  No results  found for: "WBC", "RBC", "HGB", "HCT", "PLT", "MCV", "MCH", "MCHC", "RDW", "LYMPHSABS", "MONOABS", "EOSABS", "BASOSABS"  No results found for: "POCLITH", "LITHIUM"   No results found for: "PHENYTOIN", "PHENOBARB", "VALPROATE", "CBMZ"   .res Assessment: Plan:    Plan:  PDMP reviewed  1. Zoloft 100 mg every morning after breakfast  2. Wellbutrin 150 mg XL every morning after breakfast  3. Clonidine 0.1 mg every bedtime  4. Add Adderall 20mg  - 1/2 tab BID  [facility] 43/58 - ADHD likely - previous diagnosis on file.  114/74/85  RTC 6 months  Patient advised to contact office with any questions, adverse effects, or acute worsening in signs and symptoms.  Diagnoses and all orders for this visit:  Attention deficit hyperactivity disorder (ADHD), inattentive type, mild -     amphetamine-dextroamphetamine (ADDERALL) 20 MG tablet; Take 1 tablet (20 mg total) by mouth daily.  Generalized anxiety disorder  Mixed obsessional thoughts and acts  Post-traumatic stress disorder, acute     Please see After Visit Summary for patient specific instructions.  Future Appointments  Date Time Provider Department Center  02/23/2022  2:00 PM Johnny Hubbard, 02/25/2022, NP [facility] None    No orders of the defined types were placed in this encounter.   -------------------------------

## 2022-02-23 ENCOUNTER — Telehealth (INDEPENDENT_AMBULATORY_CARE_PROVIDER_SITE_OTHER): Payer: Self-pay | Admitting: Adult Health

## 2022-02-23 DIAGNOSIS — F489 Nonpsychotic mental disorder, unspecified: Secondary | ICD-10-CM

## 2022-02-23 NOTE — Progress Notes (Signed)
Patient no show appointment. Did not connect for video appointment. Called - VM full - unable to leave message.

## 2022-03-07 ENCOUNTER — Encounter: Payer: Self-pay | Admitting: Adult Health

## 2022-03-07 ENCOUNTER — Ambulatory Visit (INDEPENDENT_AMBULATORY_CARE_PROVIDER_SITE_OTHER): Payer: BC Managed Care – PPO | Admitting: Adult Health

## 2022-03-07 DIAGNOSIS — F422 Mixed obsessional thoughts and acts: Secondary | ICD-10-CM | POA: Diagnosis not present

## 2022-03-07 DIAGNOSIS — F900 Attention-deficit hyperactivity disorder, predominantly inattentive type: Secondary | ICD-10-CM

## 2022-03-07 DIAGNOSIS — F411 Generalized anxiety disorder: Secondary | ICD-10-CM

## 2022-03-07 DIAGNOSIS — F4311 Post-traumatic stress disorder, acute: Secondary | ICD-10-CM | POA: Diagnosis not present

## 2022-03-07 MED ORDER — AMPHETAMINE-DEXTROAMPHET ER 20 MG PO CP24: 20.0000 mg | ORAL_CAPSULE | Freq: Every day | ORAL | 0 refills | Status: DC

## 2022-03-07 MED ORDER — AMPHETAMINE-DEXTROAMPHETAMINE 20 MG PO TABS: 20.0000 mg | ORAL_TABLET | Freq: Every day | ORAL | 0 refills | Status: DC

## 2022-03-07 NOTE — Progress Notes (Signed)
Johnny Hubbard [id number] [birth year] 21 y.o.  Subjective:   Patient ID:  Johnny Hubbard is a 21 y.o. (DOB [birth year]) male.  Chief Complaint: No chief complaint on file.   HPI Johnny Hubbard presents to the office today for follow-up of GAD, ADHD, PTSD, Obsessional thoughts and acts.  Describes mood today as "ok". Pleasant. Denies tearfulness. Mood symptoms - denies depression, anxiety, and irritability. Denies worry and rumination. Denies obsessive thoughts. Stating "I'm doing better". Reports decreased issues with focus and concentration with addition of Adderall. Seeing Dr. Dellia Cloud for therapy as needed. Stable interest and motivation. Taking medications as prescribed.  Energy levels stable. Active, has a regular exercise routine.  . Enjoys some usual interests and activities. In a relationship Parents divorced. Lives with mother. Has a brother at [facility]. Spending time with family. Appetite adequate. Weight stable - 163 pounds. Sleeps well most nights. Averages 7 to 8 hours. Focus and concentration improved with addition of Adderall - previous diagnosis of ADHD. Completing tasks. Managing aspects of household. Attends [employer]. Denies SI or HI.  Denies AH or VH. Denies self harm. Denies substance use.   Previous medication trials: Denies  Review of Systems:  Review of Systems  Musculoskeletal:  Negative for gait problem.  Neurological:  Negative for tremors.  Psychiatric/Behavioral:         Please refer to HPI    Medications: I have reviewed the patient's current medications.  Current Outpatient Medications  Medication Sig Dispense Refill   amphetamine-dextroamphetamine (ADDERALL XR) 20 MG 24 hr capsule Take 1 capsule (20 mg total) by mouth daily. 30 capsule 0   amphetamine-dextroamphetamine (ADDERALL) 20 MG tablet Take 1 tablet (20 mg total) by mouth daily. 30 tablet 0   buPROPion (WELLBUTRIN XL) 150 MG 24 hr tablet Take 1 tablet (150 mg total) by mouth daily  after breakfast. 90 tablet 3   cloNIDine (CATAPRES) 0.1 MG tablet Take 1 tablet (0.1 mg total) by mouth at bedtime. 90 tablet 3   penicillin v potassium (VEETID) 500 MG tablet Take 1 tablet (500 mg total) by mouth 2 (two) times daily. 20 tablet 0   sertraline (ZOLOFT) 100 MG tablet Take one and 1/2 tablets daily. 135 tablet 3   No current facility-administered medications for this visit.    Medication Side Effects: None  Allergies: No Known Allergies  Past Medical History:  Diagnosis Date   Anxiety    Asthma     Past Medical History, Surgical history, Social history, and Family history were reviewed and updated as appropriate.   Please see review of systems for further details on the patient's review from today.   Objective:   Physical Exam:  There were no vitals taken for this visit.  Physical Exam Constitutional:      General: He is not in acute distress. Musculoskeletal:        General: No deformity.  Neurological:     Mental Status: He is alert and oriented to person, place, and time.     Coordination: Coordination normal.  Psychiatric:        Attention and Perception: Attention and perception normal. He does not perceive auditory or visual hallucinations.        Mood and Affect: Mood normal. Mood is not anxious or depressed. Affect is not labile, blunt, angry or inappropriate.        Speech: Speech normal.        Behavior: Behavior normal.        Thought  Content: Thought content normal. Thought content is not paranoid or delusional. Thought content does not include homicidal or suicidal ideation. Thought content does not include homicidal or suicidal plan.        Cognition and Memory: Cognition and memory normal.        Judgment: Judgment normal.     Comments: Insight intact     Lab Review:  No results found for: "NA", "K", "CL", "CO2", "GLUCOSE", "BUN", "CREATININE", "CALCIUM", "PROT", "ALBUMIN", "AST", "ALT", "ALKPHOS", "BILITOT", "GFRNONAA", "GFRAA"  No  results found for: "WBC", "RBC", "HGB", "HCT", "PLT", "MCV", "MCH", "MCHC", "RDW", "LYMPHSABS", "MONOABS", "EOSABS", "BASOSABS"  No results found for: "POCLITH", "LITHIUM"   No results found for: "PHENYTOIN", "PHENOBARB", "VALPROATE", "CBMZ"   .res Assessment: Plan:    Plan:  PDMP reviewed  1. D/C Zoloft 100 mg every morning after breakfast  2. D/C Wellbutrin 150 mg XL every morning after breakfast  3. Clonidine 0.1 mg every bedtime  4. Add Adderall 20mg  - 1/2 tab BID 5. Adderall XR 20mg  every morning.   [facility] 43/58 - ADHD likely - previous diagnosis on file.  105/68/53  RTC 3 months - will call in a month with update on medication changes.  Patient advised to contact office with any questions, adverse effects, or acute worsening in signs and symptoms.  Discussed potential benefits, risks, and side effects of stimulants with patient to include increased heart rate, palpitations, insomnia, increased anxiety, increased irritability, or decreased appetite.  Instructed patient to contact office if experiencing any significant tolerability issues.   Diagnoses and all orders for this visit:  Attention deficit hyperactivity disorder (ADHD), inattentive type, mild -     amphetamine-dextroamphetamine (ADDERALL) 20 MG tablet; Take 1 tablet (20 mg total) by mouth daily.  Generalized anxiety disorder  Post-traumatic stress disorder, acute  Mixed obsessional thoughts and acts  Other orders -     amphetamine-dextroamphetamine (ADDERALL XR) 20 MG 24 hr capsule; Take 1 capsule (20 mg total) by mouth daily.     Please see After Visit Summary for patient specific instructions.  No future appointments.   No orders of the defined types were placed in this encounter.   -------------------------------

## 2022-03-16 DIAGNOSIS — M79642 Pain in left hand: Secondary | ICD-10-CM | POA: Diagnosis not present

## 2022-03-16 DIAGNOSIS — M65352 Trigger finger, left little finger: Secondary | ICD-10-CM | POA: Diagnosis not present

## 2022-03-23 DIAGNOSIS — M65352 Trigger finger, left little finger: Secondary | ICD-10-CM | POA: Diagnosis not present

## 2022-04-23 ENCOUNTER — Other Ambulatory Visit: Payer: Self-pay | Admitting: Adult Health

## 2022-04-23 DIAGNOSIS — F411 Generalized anxiety disorder: Secondary | ICD-10-CM

## 2022-04-23 DIAGNOSIS — F900 Attention-deficit hyperactivity disorder, predominantly inattentive type: Secondary | ICD-10-CM

## 2022-05-08 DIAGNOSIS — M65352 Trigger finger, left little finger: Secondary | ICD-10-CM | POA: Diagnosis not present

## 2022-05-14 ENCOUNTER — Other Ambulatory Visit: Payer: Self-pay

## 2022-05-14 ENCOUNTER — Telehealth: Payer: Self-pay | Admitting: Adult Health

## 2022-05-14 DIAGNOSIS — F900 Attention-deficit hyperactivity disorder, predominantly inattentive type: Secondary | ICD-10-CM

## 2022-05-14 MED ORDER — AMPHETAMINE-DEXTROAMPHET ER 20 MG PO CP24
20.0000 mg | ORAL_CAPSULE | Freq: Every day | ORAL | 0 refills | Status: DC
Start: 2022-05-14 — End: 2022-05-15

## 2022-05-14 MED ORDER — AMPHETAMINE-DEXTROAMPHETAMINE 20 MG PO TABS: 20.0000 mg | ORAL_TABLET | Freq: Every day | ORAL | 0 refills | Status: DC

## 2022-05-14 NOTE — Telephone Encounter (Signed)
Pended.

## 2022-05-14 NOTE — Telephone Encounter (Signed)
Next visit is 06/06/22. Requesting refill on Adderall 20 mg IR called to:  [place], [place]  Phone:  [phone]  Fax:  [phone]    Per patient it is in stock

## 2022-05-15 ENCOUNTER — Other Ambulatory Visit: Payer: Self-pay

## 2022-05-15 DIAGNOSIS — F900 Attention-deficit hyperactivity disorder, predominantly inattentive type: Secondary | ICD-10-CM

## 2022-05-15 MED ORDER — AMPHETAMINE-DEXTROAMPHET ER 20 MG PO CP24
20.0000 mg | ORAL_CAPSULE | Freq: Every day | ORAL | 0 refills | Status: DC
Start: 2022-05-15 — End: 2022-06-12

## 2022-05-15 MED ORDER — AMPHETAMINE-DEXTROAMPHETAMINE 20 MG PO TABS: 20.0000 mg | ORAL_TABLET | Freq: Every day | ORAL | 0 refills | Status: DC

## 2022-05-15 NOTE — Telephone Encounter (Signed)
Cancelled and pended  

## 2022-05-15 NOTE — Telephone Encounter (Signed)
Addendum to attached message from 10/16. Next visit is 06/06/22. Requesting Adderall 20 mg IR called to   [facility] [place], [place] - [place] [facility]  Phone:  [phone]  Fax:  [phone]    Instead of:  [place], [place] [address]  Phone:  [phone]  Fax:  [phone]

## 2022-05-31 DIAGNOSIS — J029 Acute pharyngitis, unspecified: Secondary | ICD-10-CM | POA: Diagnosis not present

## 2022-05-31 DIAGNOSIS — R070 Pain in throat: Secondary | ICD-10-CM | POA: Diagnosis not present

## 2022-06-06 ENCOUNTER — Telehealth (INDEPENDENT_AMBULATORY_CARE_PROVIDER_SITE_OTHER): Payer: BC Managed Care – PPO | Admitting: Adult Health

## 2022-06-12 ENCOUNTER — Ambulatory Visit (INDEPENDENT_AMBULATORY_CARE_PROVIDER_SITE_OTHER): Payer: BC Managed Care – PPO | Admitting: Adult Health

## 2022-06-12 ENCOUNTER — Encounter: Payer: Self-pay | Admitting: Adult Health

## 2022-06-12 DIAGNOSIS — F422 Mixed obsessional thoughts and acts: Secondary | ICD-10-CM | POA: Diagnosis not present

## 2022-06-12 DIAGNOSIS — F4311 Post-traumatic stress disorder, acute: Secondary | ICD-10-CM

## 2022-06-12 DIAGNOSIS — F900 Attention-deficit hyperactivity disorder, predominantly inattentive type: Secondary | ICD-10-CM | POA: Diagnosis not present

## 2022-06-12 DIAGNOSIS — F411 Generalized anxiety disorder: Secondary | ICD-10-CM

## 2022-06-12 MED ORDER — AMPHETAMINE-DEXTROAMPHETAMINE 20 MG PO TABS: 20.0000 mg | ORAL_TABLET | Freq: Every day | ORAL | 0 refills | Status: DC

## 2022-06-12 MED ORDER — AMPHETAMINE-DEXTROAMPHET ER 20 MG PO CP24: 20.0000 mg | ORAL_CAPSULE | Freq: Every day | ORAL | 0 refills | Status: DC

## 2022-06-12 MED ORDER — CLONIDINE HCL 0.1 MG PO TABS: ORAL_TABLET | ORAL | 1 refills | Status: DC

## 2022-06-12 NOTE — Progress Notes (Signed)
Johnny Hubbard [id number] 2000-09-02 21 y.o.  Virtual Visit via Telephone Note  I connected with pt on 06/12/22 at  8:00 AM EST by telephone and verified that I am speaking with the correct person using two identifiers.   I discussed the limitations, risks, security and privacy concerns of performing an evaluation and management service by telephone and the availability of in person appointments. I also discussed with the patient that there may be a patient responsible charge related to this service. The patient expressed understanding and agreed to proceed.   I discussed the assessment and treatment plan with the patient. The patient was provided an opportunity to ask questions and all were answered. The patient agreed with the plan and demonstrated an understanding of the instructions.   The patient was advised to call back or seek an in-person evaluation if the symptoms worsen or if the condition fails to improve as anticipated.  I provided 25 minutes of non-face-to-face time during this encounter.  The patient was located at home.  The provider was located at [facility].   Dorothyann Gibbs, NP   Subjective:   Patient ID:  Johnny Hubbard is a 21 y.o. (DOB Nov 15, 2000) male.  Chief Complaint: No chief complaint on file.   HPI Johnny Hubbard presents for follow-up of GAD, ADHD, PTSD, Obsessional thoughts and acts.  Describes mood today as "ok". Pleasant. Denies tearfulness. Mood symptoms - denies depression, anxiety, and irritability. Denies worry and rumination. Denies obsessive thoughts. Mood is consistent. Stating "I'm doing alright". Seeing Dr. Dellia Cloud for therapy as needed. Stable interest and motivation. Taking medications as prescribed.  Energy levels stable. Active, has a regular exercise routine.  . Enjoys some usual interests and activities. Single. Parents divorced. Lives with mother when home - attends [employer]. Has a brother at [employer]. Spending time  with family. Appetite adequate. Weight stable - 163 pounds. Sleeps well most nights. Averages 7 to 8 hours. Focus and concentration improved with addition of Adderall. Completing tasks. Managing aspects of household. Attends [employer]. Denies SI or HI.  Denies AH or VH. Denies self harm. Denies substance use.   Previous medication trials: Denies   Review of Systems:  Review of Systems  Musculoskeletal:  Negative for gait problem.  Neurological:  Negative for tremors.  Psychiatric/Behavioral:         Please refer to HPI    Medications: I have reviewed the patient's current medications.  Current Outpatient Medications  Medication Sig Dispense Refill   amphetamine-dextroamphetamine (ADDERALL XR) 20 MG 24 hr capsule Take 1 capsule (20 mg total) by mouth daily. 30 capsule 0   amphetamine-dextroamphetamine (ADDERALL) 20 MG tablet Take 1 tablet (20 mg total) by mouth daily. 30 tablet 0   buPROPion (WELLBUTRIN XL) 150 MG 24 hr tablet Take 1 tablet (150 mg total) by mouth daily after breakfast. 90 tablet 3   cloNIDine (CATAPRES) 0.1 MG tablet TAKE 1 TABLET BY MOUTH NIGHTLY AT BEDTIME AS DIRECTED 90 tablet 0   penicillin v potassium (VEETID) 500 MG tablet Take 1 tablet (500 mg total) by mouth 2 (two) times daily. 20 tablet 0   sertraline (ZOLOFT) 100 MG tablet Take one and 1/2 tablets daily. 135 tablet 3   No current facility-administered medications for this visit.    Medication Side Effects: None  Allergies: No Known Allergies  Past Medical History:  Diagnosis Date   Anxiety    Asthma     No family history on file.  Social History   Socioeconomic History   Marital status: Single    Spouse name: Not on file   Number of children: Not on file   Years of education: Not on file   Highest education level: Not on file  Occupational History   Not on file  Tobacco Use   Smoking status: Never   Smokeless tobacco: Never  Vaping Use   Vaping Use: Never used  Substance and  Sexual Activity   Alcohol use: Never    Alcohol/week: 0.0 standard drinks of alcohol   Drug use: Never   Sexual activity: Never  Other Topics Concern   Not on file  Social History Narrative   Not on file   Social Determinants of Health   Financial Resource Strain: Not on file  Food Insecurity: Not on file  Transportation Needs: Not on file  Physical Activity: Not on file  Stress: Not on file  Social Connections: Not on file  Intimate Partner Violence: Not on file    Past Medical History, Surgical history, Social history, and Family history were reviewed and updated as appropriate.   Please see review of systems for further details on the patient's review from today.   Objective:   Physical Exam:  There were no vitals taken for this visit.  Physical Exam Constitutional:      General: He is not in acute distress. Musculoskeletal:        General: No deformity.  Neurological:     Mental Status: He is alert and oriented to person, place, and time.     Coordination: Coordination normal.  Psychiatric:        Attention and Perception: Attention and perception normal. He does not perceive auditory or visual hallucinations.        Mood and Affect: Mood normal. Mood is not anxious or depressed. Affect is not labile, blunt, angry or inappropriate.        Speech: Speech normal.        Behavior: Behavior normal.        Thought Content: Thought content normal. Thought content is not paranoid or delusional. Thought content does not include homicidal or suicidal ideation. Thought content does not include homicidal or suicidal plan.        Cognition and Memory: Cognition and memory normal.        Judgment: Judgment normal.     Comments: Insight intact     Lab Review:  No results found for: "NA", "K", "CL", "CO2", "GLUCOSE", "BUN", "CREATININE", "CALCIUM", "PROT", "ALBUMIN", "AST", "ALT", "ALKPHOS", "BILITOT", "GFRNONAA", "GFRAA"  No results found for: "WBC", "RBC", "HGB", "HCT",  "PLT", "MCV", "MCH", "MCHC", "RDW", "LYMPHSABS", "MONOABS", "EOSABS", "BASOSABS"  No results found for: "POCLITH", "LITHIUM"   No results found for: "PHENYTOIN", "PHENOBARB", "VALPROATE", "CBMZ"   .res Assessment: Plan:    Plan:  PDMP reviewed  Clonidine 0.1 mg every bedtime  Adderall 20mg  - 1/2 tab BID Adderall XR 20mg  every morning.   [facility] 43/58 - ADHD likely - previous diagnosis on file.  Monitor BP between visits while taking stimulant medication.   RTC 3 months   Patient advised to contact office with any questions, adverse effects, or acute worsening in signs and symptoms.  Discussed potential benefits, risks, and side effects of stimulants with patient to include increased heart rate, palpitations, insomnia, increased anxiety, increased irritability, or decreased appetite.  Instructed patient to contact office if experiencing any significant tolerability issues.   There are no diagnoses linked to this encounter.  Please see After Visit Summary for patient specific instructions.  Future Appointments  Date Time Provider Department Center  06/12/2022  8:00 AM Arlon Bleier, Thereasa Solo, NP [facility] None    No orders of the defined types were placed in this encounter.     -------------------------------

## 2022-07-04 DIAGNOSIS — J069 Acute upper respiratory infection, unspecified: Secondary | ICD-10-CM | POA: Diagnosis not present

## 2022-08-20 ENCOUNTER — Telehealth: Payer: Self-pay | Admitting: Adult Health

## 2022-08-20 NOTE — Telephone Encounter (Signed)
What does did he restart at?

## 2022-08-20 NOTE — Telephone Encounter (Signed)
Pt stated 50 mg 1 daily

## 2022-08-20 NOTE — Telephone Encounter (Signed)
LVM to rtc with dose

## 2022-08-20 NOTE — Telephone Encounter (Signed)
Please advise 

## 2022-08-20 NOTE — Telephone Encounter (Signed)
Pt informed

## 2022-08-20 NOTE — Telephone Encounter (Signed)
Patient lvm at 10:36 recently started taking Setraline again and believes that is working well but it making him sweat. Particularly his hands while in class that "seem really sweaty." He would like to know if this is normal or anything else can be done. Ph: [phone]

## 2022-08-20 NOTE — Telephone Encounter (Signed)
Pt stated he restarted 3 weeks ago and has had this problem since.He does not think he had this problem previously but stated med is helpful

## 2022-08-20 NOTE — Telephone Encounter (Signed)
It could be a side effect - may or may not get better. I don't recall that being a previous issue - does he?

## 2022-08-20 NOTE — Telephone Encounter (Signed)
Addendum,VM full

## 2022-09-21 ENCOUNTER — Telehealth: Payer: Self-pay | Admitting: Adult Health

## 2022-09-21 NOTE — Telephone Encounter (Signed)
Pt called reporting sweating hasn't gotten better while taking Sertraline. What's the next step. Contact # [phone]

## 2022-09-24 NOTE — Telephone Encounter (Signed)
He is currently taking 100 mg Zoloft and has been for several weeks now he reports

## 2022-09-24 NOTE — Telephone Encounter (Signed)
Is he taking the '50mg'$  daily?

## 2022-09-24 NOTE — Telephone Encounter (Signed)
Ok, we can taper him back down/off since the "sweating" hasn't gotten any better - then we can consider other options.

## 2022-09-24 NOTE — Telephone Encounter (Signed)
Rtc to pt and he reports the sweating has not gotten any better and he's been taking it for quite awhile. Asking what's the next step from here?

## 2022-09-25 NOTE — Telephone Encounter (Signed)
Tried calling pt back with information but no answer and his voicemail is full

## 2022-11-21 ENCOUNTER — Telehealth (INDEPENDENT_AMBULATORY_CARE_PROVIDER_SITE_OTHER): Payer: BC Managed Care – PPO | Admitting: Adult Health

## 2022-11-21 ENCOUNTER — Encounter: Payer: Self-pay | Admitting: Adult Health

## 2022-11-21 DIAGNOSIS — F422 Mixed obsessional thoughts and acts: Secondary | ICD-10-CM | POA: Diagnosis not present

## 2022-11-21 DIAGNOSIS — F4311 Post-traumatic stress disorder, acute: Secondary | ICD-10-CM

## 2022-11-21 DIAGNOSIS — F900 Attention-deficit hyperactivity disorder, predominantly inattentive type: Secondary | ICD-10-CM

## 2022-11-21 DIAGNOSIS — F411 Generalized anxiety disorder: Secondary | ICD-10-CM

## 2022-11-21 MED ORDER — AMPHETAMINE-DEXTROAMPHETAMINE 20 MG PO TABS: 20.0000 mg | ORAL_TABLET | Freq: Every day | ORAL | 0 refills | Status: DC

## 2022-11-21 MED ORDER — AMPHETAMINE-DEXTROAMPHET ER 20 MG PO CP24: 20.0000 mg | ORAL_CAPSULE | Freq: Every day | ORAL | 0 refills | Status: DC

## 2022-11-21 MED ORDER — ESCITALOPRAM OXALATE 5 MG PO TABS: 5.0000 mg | ORAL_TABLET | Freq: Every day | ORAL | 2 refills | Status: DC

## 2022-11-21 NOTE — Progress Notes (Signed)
Johnny Hubbard [id number] 08-24-2000 22 y.o.  Virtual Visit via Video Note  I connected with pt @ on 11/21/22 at  8:40 AM EDT by a video enabled telemedicine application and verified that I am speaking with the correct person using two identifiers.   I discussed the limitations of evaluation and management by telemedicine and the availability of in person appointments. The patient expressed understanding and agreed to proceed.  I discussed the assessment and treatment plan with the patient. The patient was provided an opportunity to ask questions and all were answered. The patient agreed with the plan and demonstrated an understanding of the instructions.   The patient was advised to call back or seek an in-person evaluation if the symptoms worsen or if the condition fails to improve as anticipated.  I provided 25 minutes of non-face-to-face time during this encounter.  The patient was located at home.  The provider was located at [facility].   Dorothyann Gibbs, NP   Subjective:   Patient ID:  Johnny Hubbard is a 22 y.o. (DOB [birth year]) male.  Chief Complaint: No chief complaint on file.   HPI Benz KYAL ARTS presents for follow-up of GAD, ADHD, PTSD, Obsessional thoughts and acts.  Describes mood today as "ok". Pleasant. Denies tearfulness. Mood symptoms - denies depression and irritability. Feels increased anxious. Reports some worry, rumination, and over thinking. Denies obsessive thoughts. Mood is consistent. Stating "I'm having a lot of anxiety". Was not able to continue Zoloft and Wellbutrin due to increased sweating. Willing to consider other options. Seeing Dr. Dellia Cloud for therapy as needed. Stable interest and motivation. Taking medications as prescribed.  Energy levels stable. Active, has a regular exercise routine.  Enjoys some usual interests and activities. Single. Parents divorced. Lives with mother when home - attends [employer]. Has a brother at [employer]. Spending time with family. Appetite adequate. Weight stable - 163 pounds. Sleeps well most nights. Averages 7 to 8 hours. Focus and concentration improved with addition of Adderall. Completing tasks. Managing aspects of household. Attends [employer] - sophomore. Denies SI or HI.  Denies AH or VH. Denies self harm. Denies substance use.   Previous medication trials: Zoloft, Wellbutrin  Review of Systems:  Review of Systems  Musculoskeletal:  Negative for gait problem.  Neurological:  Negative for tremors.  Psychiatric/Behavioral:         Please refer to HPI    Medications: I have reviewed the patient's current medications.  Current Outpatient Medications  Medication Sig Dispense Refill   amphetamine-dextroamphetamine (ADDERALL XR) 20 MG 24 hr capsule Take 1 capsule (20 mg total) by mouth daily. 30 capsule 0   amphetamine-dextroamphetamine (ADDERALL XR) 20 MG 24 hr capsule Take 1 capsule (20 mg total) by mouth daily. 30 capsule 0   amphetamine-dextroamphetamine (ADDERALL XR) 20 MG 24 hr capsule Take 1 capsule (20 mg total) by mouth daily. 30 capsule 0   amphetamine-dextroamphetamine (ADDERALL) 20 MG tablet Take 1 tablet (20 mg total) by mouth daily. 30 tablet 0   amphetamine-dextroamphetamine (ADDERALL) 20 MG tablet Take 1 tablet (20 mg total) by mouth daily. 30 tablet 0   amphetamine-dextroamphetamine (ADDERALL) 20 MG tablet Take 1 tablet (20 mg total) by mouth daily. 30 tablet 0   cloNIDine (CATAPRES) 0.1 MG tablet TAKE 1 TABLET BY MOUTH NIGHTLY AT BEDTIME AS DIRECTED 90 tablet 1   penicillin v potassium (VEETID) 500 MG tablet Take 1 tablet (500 mg total) by mouth 2 (two) times daily. 20 tablet  0   No current facility-administered medications for this visit.    Medication Side Effects: None  Allergies: No Known Allergies  Past Medical History:  Diagnosis Date   Anxiety    Asthma     No family history on file.  Social History   Socioeconomic History   Marital  status: Single    Spouse name: Not on file   Number of children: Not on file   Years of education: Not on file   Highest education level: Not on file  Occupational History   Not on file  Tobacco Use   Smoking status: Never   Smokeless tobacco: Never  Vaping Use   Vaping Use: Never used  Substance and Sexual Activity   Alcohol use: Never    Alcohol/week: 0.0 standard drinks of alcohol   Drug use: Never   Sexual activity: Never  Other Topics Concern   Not on file  Social History Narrative   Not on file   Social Determinants of Health   Financial Resource Strain: Not on file  Food Insecurity: Not on file  Transportation Needs: Not on file  Physical Activity: Not on file  Stress: Not on file  Social Connections: Not on file  Intimate Partner Violence: Not on file    Past Medical History, Surgical history, Social history, and Family history were reviewed and updated as appropriate.   Please see review of systems for further details on the patient's review from today.   Objective:   Physical Exam:  There were no vitals taken for this visit.  Physical Exam Constitutional:      General: He is not in acute distress. Musculoskeletal:        General: No deformity.  Neurological:     Mental Status: He is alert and oriented to person, place, and time.     Coordination: Coordination normal.  Psychiatric:        Attention and Perception: Attention and perception normal. He does not perceive auditory or visual hallucinations.        Mood and Affect: Mood normal. Mood is not anxious or depressed. Affect is not labile, blunt, angry or inappropriate.        Speech: Speech normal.        Behavior: Behavior normal.        Thought Content: Thought content normal. Thought content is not paranoid or delusional. Thought content does not include homicidal or suicidal ideation. Thought content does not include homicidal or suicidal plan.        Cognition and Memory: Cognition and memory  normal.        Judgment: Judgment normal.     Comments: Insight intact     Lab Review:  No results found for: "NA", "K", "CL", "CO2", "GLUCOSE", "BUN", "CREATININE", "CALCIUM", "PROT", "ALBUMIN", "AST", "ALT", "ALKPHOS", "BILITOT", "GFRNONAA", "GFRAA"  No results found for: "WBC", "RBC", "HGB", "HCT", "PLT", "MCV", "MCH", "MCHC", "RDW", "LYMPHSABS", "MONOABS", "EOSABS", "BASOSABS"  No results found for: "POCLITH", "LITHIUM"   No results found for: "PHENYTOIN", "PHENOBARB", "VALPROATE", "CBMZ"   .res Assessment: Plan:    Plan:  PDMP reviewed  Clonidine 0.1 mg every bedtime  Adderall  - 1/2 tab BID Adderall XR  every morning.   Add Lexapro  daily  [facility] 43/58 - ADHD likely - previous diagnosis on file.  Monitor BP between visits while taking stimulant medication.   RTC 4 weeks.  Patient advised to contact office with any questions, adverse effects, or acute worsening in signs and  symptoms.  Discussed potential benefits, risks, and side effects of stimulants with patient to include increased heart rate, palpitations, insomnia, increased anxiety, increased irritability, or decreased appetite.  Instructed patient to contact office if experiencing any significant tolerability issues.   There are no diagnoses linked to this encounter.   Please see After Visit Summary for patient specific instructions.  No future appointments.  No orders of the defined types were placed in this encounter.     -------------------------------

## 2023-01-14 ENCOUNTER — Telehealth: Payer: Self-pay | Admitting: Adult Health

## 2023-01-14 ENCOUNTER — Other Ambulatory Visit: Payer: Self-pay | Admitting: Adult Health

## 2023-01-14 DIAGNOSIS — F900 Attention-deficit hyperactivity disorder, predominantly inattentive type: Secondary | ICD-10-CM

## 2023-01-14 MED ORDER — AMPHETAMINE-DEXTROAMPHETAMINE 20 MG PO TABS: 20.0000 mg | ORAL_TABLET | Freq: Every day | ORAL | 0 refills | Status: DC

## 2023-01-14 NOTE — Telephone Encounter (Signed)
Pt called requesting Rx generic adderall 20 mg IR only at this time    [facility]. Apt 7/17

## 2023-01-14 NOTE — Telephone Encounter (Signed)
Script sent  

## 2023-02-13 ENCOUNTER — Encounter: Payer: Self-pay | Admitting: Adult Health

## 2023-02-13 ENCOUNTER — Telehealth (INDEPENDENT_AMBULATORY_CARE_PROVIDER_SITE_OTHER): Payer: BC Managed Care – PPO | Admitting: Adult Health

## 2023-02-13 DIAGNOSIS — F900 Attention-deficit hyperactivity disorder, predominantly inattentive type: Secondary | ICD-10-CM

## 2023-02-13 DIAGNOSIS — F411 Generalized anxiety disorder: Secondary | ICD-10-CM | POA: Diagnosis not present

## 2023-02-13 DIAGNOSIS — F4311 Post-traumatic stress disorder, acute: Secondary | ICD-10-CM

## 2023-02-13 DIAGNOSIS — F422 Mixed obsessional thoughts and acts: Secondary | ICD-10-CM | POA: Diagnosis not present

## 2023-02-13 MED ORDER — AMPHETAMINE-DEXTROAMPHET ER 20 MG PO CP24: 20.0000 mg | ORAL_CAPSULE | Freq: Every day | ORAL | 0 refills | Status: DC

## 2023-02-13 MED ORDER — AMPHETAMINE-DEXTROAMPHETAMINE 20 MG PO TABS: 20.0000 mg | ORAL_TABLET | Freq: Every day | ORAL | 0 refills | Status: DC

## 2023-02-13 MED ORDER — AMPHETAMINE-DEXTROAMPHETAMINE 20 MG PO TABS
20.0000 mg | ORAL_TABLET | Freq: Every day | ORAL | 0 refills | Status: DC
Start: 2023-03-13 — End: 2023-05-17

## 2023-02-13 NOTE — Progress Notes (Signed)
Johnny Hubbard [id number] [birth year] 22 y.o.  Virtual Visit via Video Note  I connected with pt @ on 02/13/23 at  1:00 PM EDT by a video enabled telemedicine application and verified that I am speaking with the correct person using two identifiers.   I discussed the limitations of evaluation and management by telemedicine and the availability of in person appointments. The patient expressed understanding and agreed to proceed.  I discussed the assessment and treatment plan with the patient. The patient was provided an opportunity to ask questions and all were answered. The patient agreed with the plan and demonstrated an understanding of the instructions.   The patient was advised to call back or seek an in-person evaluation if the symptoms worsen or if the condition fails to improve as anticipated.  I provided 25 minutes of non-face-to-face time during this encounter.  The patient was located at home.  The provider was located at [facility].   Dorothyann Gibbs, NP   Subjective:   Patient ID:  Johnny Hubbard is a 22 y.o. (DOB [birth year]) male.  Chief Complaint: No chief complaint on file.   HPI Irbin DILLEN BELMONTES presents for follow-up of GAD, ADHD, PTSD, Obsessional thoughts and acts.  Describes mood today as "ok". Pleasant. Denies tearfulness. Mood symptoms - denies depression and irritability. Reports anxiety - "pretty constant". Reports some worry, rumination, and over thinking. Denies obsessive thoughts or acts. Mood is consistent. Stating "I feel like I'm doing alright". Feels like current medications are helpful - taking Adderall only. Stable interest and motivation. Taking medications as prescribed.  Energy levels "ok". Active, has a regular exercise routine.  Enjoys some usual interests and activities. Single. Lives with roommates. Parents divorced. Lives with mother when home - attends [employer]. Brother recently from [facility]. Spending time with  family. Appetite adequate. Weight stable - 163 pounds. Sleeps well most nights. Averages 7 to 8 hours - using Xanax. Focus and concentration improved with addition of Adderall. Completing tasks. Managing aspects of household. Attends [employer] - rising [employer]. Denies SI or HI.  Denies AH or VH. Denies self harm. Denies substance use.   Previous medication trials: Zoloft, Wellbutrin   Review of Systems:  Review of Systems  Musculoskeletal:  Negative for gait problem.  Neurological:  Negative for tremors.  Psychiatric/Behavioral:         Please refer to HPI    Medications: I have reviewed the patient's current medications.  Current Outpatient Medications  Medication Sig Dispense Refill   amphetamine-dextroamphetamine (ADDERALL XR) 20 MG 24 hr capsule Take 1 capsule (20 mg total) by mouth daily. 30 capsule 0   [START ON 03/13/2023] amphetamine-dextroamphetamine (ADDERALL XR) 20 MG 24 hr capsule Take 1 capsule (20 mg total) by mouth daily. 30 capsule 0   [START ON 04/10/2023] amphetamine-dextroamphetamine (ADDERALL XR) 20 MG 24 hr capsule Take 1 capsule (20 mg total) by mouth daily. 30 capsule 0   amphetamine-dextroamphetamine (ADDERALL) 20 MG tablet Take 1 tablet (20 mg total) by mouth daily. 30 tablet 0   [START ON 03/13/2023] amphetamine-dextroamphetamine (ADDERALL) 20 MG tablet Take 1 tablet (20 mg total) by mouth daily. 30 tablet 0   [START ON 04/10/2023] amphetamine-dextroamphetamine (ADDERALL) 20 MG tablet Take 1 tablet (20 mg total) by mouth daily. 30 tablet 0   penicillin v potassium (VEETID) 500 MG tablet Take 1 tablet (500 mg total) by mouth 2 (two) times daily. 20 tablet 0   No current facility-administered medications for this visit.  Medication Side Effects: None  Allergies: No Known Allergies  Past Medical History:  Diagnosis Date   Anxiety    Asthma     No family history on file.  Social History   Socioeconomic History   Marital status: Single    Spouse  name: Not on file   Number of children: Not on file   Years of education: Not on file   Highest education level: Not on file  Occupational History   Not on file  Tobacco Use   Smoking status: Never   Smokeless tobacco: Never  Vaping Use   Vaping status: Never Used  Substance and Sexual Activity   Alcohol use: Never    Alcohol/week: 0.0 standard drinks of alcohol   Drug use: Never   Sexual activity: Never  Other Topics Concern   Not on file  Social History Narrative   Not on file   Social Determinants of Health   Financial Resource Strain: Low Risk  (11/29/2022)   Received from [facility], [facility]   Overall Financial Resource Strain (CARDIA)    Difficulty of Paying Living Expenses: Not hard at all  Food Insecurity: No Food Insecurity (11/29/2022)   Received from [facility], [facility]   Hunger Vital Sign    Worried About Running Out of Food in the Last Year: Never true    Ran Out of Food in the Last Year: Never true  Transportation Needs: No Transportation Needs (11/29/2022)   Received from [facility], [facility]   PRAPARE - Transportation    Lack of Transportation (Medical): No    Lack of Transportation (Non-Medical): No  Physical Activity: Not on file  Stress: Not on file  Social Connections: Unknown (10/16/2022)   Received from [facility], [facility]   Social Network    Social Network: Not on file  Intimate Partner Violence: Unknown (10/16/2022)   Received from [facility], [facility]   HITS    Physically Hurt: Not on file    Insult or Talk Down To: Not on file    Threaten Physical Harm: Not on file    Scream or Curse: Not on file    Past Medical History, Surgical history, Social history, and Family history were reviewed and updated as appropriate.   Please see review of systems for further details on the patient's review from today.   Objective:   Physical Exam:  There were no vitals taken for this visit.  Physical  Exam Constitutional:      General: He is not in acute distress. Musculoskeletal:        General: No deformity.  Neurological:     Mental Status: He is alert and oriented to person, place, and time.     Coordination: Coordination normal.  Psychiatric:        Attention and Perception: Attention and perception normal. He does not perceive auditory or visual hallucinations.        Mood and Affect: Affect is not labile, blunt, angry or inappropriate.        Speech: Speech normal.        Behavior: Behavior normal.        Thought Content: Thought content normal. Thought content is not paranoid or delusional. Thought content does not include homicidal or suicidal ideation. Thought content does not include homicidal or suicidal plan.        Cognition and Memory: Cognition and memory normal.        Judgment: Judgment normal.  Comments: Insight intact     Lab Review:  No results found for: "NA", "K", "CL", "CO2", "GLUCOSE", "BUN", "CREATININE", "CALCIUM", "PROT", "ALBUMIN", "AST", "ALT", "ALKPHOS", "BILITOT", "GFRNONAA", "GFRAA"  No results found for: "WBC", "RBC", "HGB", "HCT", "PLT", "MCV", "MCH", "MCHC", "RDW", "LYMPHSABS", "MONOABS", "EOSABS", "BASOSABS"  No results found for: "POCLITH", "LITHIUM"   No results found for: "PHENYTOIN", "PHENOBARB", "VALPROATE", "CBMZ"   .res Assessment: Plan:    Plan:  PDMP reviewed  D/C Clonidine 0.1 mg every bedtime  D/C Lexapro 5mg  daily  Adderall 20mg  - 1/2 tab BID Adderall XR 20mg  every morning.   Working with a functional medicine provider. Started on Xanax yesterday.   [facility] 43/58 - ADHD likely - previous diagnosis on file.  Monitor BP between visits while taking stimulant medication.   RTC 3 months  Patient advised to contact office with any questions, adverse effects, or acute worsening in signs and symptoms.  Discussed potential benefits, risks, and side effects of stimulants with patient to include increased heart  rate, palpitations, insomnia, increased anxiety, increased irritability, or decreased appetite.  Instructed patient to contact office if experiencing any significant tolerability issues.   Diagnoses and all orders for this visit:  Attention deficit hyperactivity disorder (ADHD), inattentive type, mild -     amphetamine-dextroamphetamine (ADDERALL) 20 MG tablet; Take 1 tablet (20 mg total) by mouth daily. -     amphetamine-dextroamphetamine (ADDERALL) 20 MG tablet; Take 1 tablet (20 mg total) by mouth daily. -     amphetamine-dextroamphetamine (ADDERALL) 20 MG tablet; Take 1 tablet (20 mg total) by mouth daily. -     amphetamine-dextroamphetamine (ADDERALL XR) 20 MG 24 hr capsule; Take 1 capsule (20 mg total) by mouth daily. -     amphetamine-dextroamphetamine (ADDERALL XR) 20 MG 24 hr capsule; Take 1 capsule (20 mg total) by mouth daily. -     amphetamine-dextroamphetamine (ADDERALL XR) 20 MG 24 hr capsule; Take 1 capsule (20 mg total) by mouth daily.  Generalized anxiety disorder  Post-traumatic stress disorder, acute  Mixed obsessional thoughts and acts     Please see After Visit Summary for patient specific instructions.  No future appointments.   No orders of the defined types were placed in this encounter.     -------------------------------

## 2023-05-17 ENCOUNTER — Telehealth: Payer: BC Managed Care – PPO | Admitting: Adult Health

## 2023-05-17 ENCOUNTER — Encounter: Payer: Self-pay | Admitting: Adult Health

## 2023-05-17 DIAGNOSIS — F4311 Post-traumatic stress disorder, acute: Secondary | ICD-10-CM

## 2023-05-17 DIAGNOSIS — F909 Attention-deficit hyperactivity disorder, unspecified type: Secondary | ICD-10-CM

## 2023-05-17 DIAGNOSIS — F411 Generalized anxiety disorder: Secondary | ICD-10-CM

## 2023-05-17 DIAGNOSIS — F422 Mixed obsessional thoughts and acts: Secondary | ICD-10-CM

## 2023-05-17 DIAGNOSIS — F4310 Post-traumatic stress disorder, unspecified: Secondary | ICD-10-CM

## 2023-05-17 DIAGNOSIS — F900 Attention-deficit hyperactivity disorder, predominantly inattentive type: Secondary | ICD-10-CM

## 2023-05-17 MED ORDER — AMPHETAMINE-DEXTROAMPHET ER 20 MG PO CP24
20.0000 mg | ORAL_CAPSULE | Freq: Every day | ORAL | 0 refills | Status: DC
Start: 2023-07-12 — End: 2023-08-19

## 2023-05-17 MED ORDER — AMPHETAMINE-DEXTROAMPHET ER 20 MG PO CP24
20.0000 mg | ORAL_CAPSULE | Freq: Every day | ORAL | 0 refills | Status: DC
Start: 2023-06-14 — End: 2023-08-19

## 2023-05-17 MED ORDER — AMPHETAMINE-DEXTROAMPHETAMINE 20 MG PO TABS: 20.0000 mg | ORAL_TABLET | Freq: Every day | ORAL | 0 refills | Status: DC

## 2023-05-17 MED ORDER — AMPHETAMINE-DEXTROAMPHET ER 20 MG PO CP24
20.0000 mg | ORAL_CAPSULE | Freq: Every day | ORAL | 0 refills | Status: DC
Start: 2023-05-17 — End: 2023-08-19

## 2023-05-17 MED ORDER — AMPHETAMINE-DEXTROAMPHETAMINE 20 MG PO TABS
20.0000 mg | ORAL_TABLET | Freq: Every day | ORAL | 0 refills | Status: DC
Start: 2023-06-14 — End: 2023-08-19

## 2023-05-17 NOTE — Progress Notes (Signed)
Johnny Hubbard [id number] 2000-11-24 22 y.o.  Virtual Visit via Video Note  I connected with pt @ on 05/17/23 at 11:40 AM EDT by a video enabled telemedicine application and verified that I am speaking with the correct person using two identifiers.   I discussed the limitations of evaluation and management by telemedicine and the availability of in person appointments. The patient expressed understanding and agreed to proceed.  I discussed the assessment and treatment plan with the patient. The patient was provided an opportunity to ask questions and all were answered. The patient agreed with the plan and demonstrated an understanding of the instructions.   The patient was advised to call back or seek an in-person evaluation if the symptoms worsen or if the condition fails to improve as anticipated.  I provided 25 minutes of non-face-to-face time during this encounter.  The patient was located at home.  The provider was located at [facility].   Dorothyann Gibbs, NP   Subjective:   Patient ID:  Johnny Hubbard is a 22 y.o. (DOB [birth year]) male.  Chief Complaint: No chief complaint on file.   HPI Johnny Hubbard presents for follow-up of GAD, ADHD, PTSD, Obsessional thoughts and acts.  Describes mood today as "ok". Pleasant. Denies tearfulness. Mood symptoms - denies depression and irritability. Reports anxiety - "situational". Denies worry, rumination, and over thinking. Denies obsessive thoughts or acts. Mood is stable. Stating "I feel like I'm doing ok". Feels like current medications are helpful - taking Adderall only. Stable interest and motivation. Taking medications as prescribed.  Energy levels "ok". Active, has a regular exercise routine.  Enjoys some usual interests and activities. Single. Lives with roommates. Parents divorced. Lives with mother when home - attends [employer]. Spending time with family. Appetite adequate. Weight stable - 163 pounds. Sleeps  well most nights. Averages 6 to 7 hours.Reports some daytime napping. Focus and concentration improved with addition of Adderall. Completing tasks. Managing aspects of household. Attends [employer]. Denies SI or HI.  Denies AH or VH. Denies self harm. Denies substance use.   Previous medication trials: Zoloft, Wellbutrin  Review of Systems:  Review of Systems  Musculoskeletal:  Negative for gait problem.  Neurological:  Negative for tremors.  Psychiatric/Behavioral:         Please refer to HPI    Medications: I have reviewed the patient's current medications.  Current Outpatient Medications  Medication Sig Dispense Refill   amphetamine-dextroamphetamine (ADDERALL XR) 20 MG 24 hr capsule Take 1 capsule (20 mg total) by mouth daily. 30 capsule 0   amphetamine-dextroamphetamine (ADDERALL XR) 20 MG 24 hr capsule Take 1 capsule (20 mg total) by mouth daily. 30 capsule 0   amphetamine-dextroamphetamine (ADDERALL XR) 20 MG 24 hr capsule Take 1 capsule (20 mg total) by mouth daily. 30 capsule 0   amphetamine-dextroamphetamine (ADDERALL) 20 MG tablet Take 1 tablet (20 mg total) by mouth daily. 30 tablet 0   amphetamine-dextroamphetamine (ADDERALL) 20 MG tablet Take 1 tablet (20 mg total) by mouth daily. 30 tablet 0   amphetamine-dextroamphetamine (ADDERALL) 20 MG tablet Take 1 tablet (20 mg total) by mouth daily. 30 tablet 0   penicillin v potassium (VEETID) 500 MG tablet Take 1 tablet (500 mg total) by mouth 2 (two) times daily. 20 tablet 0   No current facility-administered medications for this visit.    Medication Side Effects: None  Allergies: No Known Allergies  Past Medical History:  Diagnosis Date   Anxiety  Asthma     No family history on file.  Social History   Socioeconomic History   Marital status: Single    Spouse name: Not on file   Number of children: Not on file   Years of education: Not on file   Highest education level: Not on file  Occupational History    Not on file  Tobacco Use   Smoking status: Never   Smokeless tobacco: Never  Vaping Use   Vaping status: Never Used  Substance and Sexual Activity   Alcohol use: Never    Alcohol/week: 0.0 standard drinks of alcohol   Drug use: Never   Sexual activity: Never  Other Topics Concern   Not on file  Social History Narrative   Not on file   Social Determinants of Health   Financial Resource Strain: Low Risk  (11/29/2022)   Received from [facility], [facility]   Overall Financial Resource Strain (CARDIA)    Difficulty of Paying Living Expenses: Not hard at all  Food Insecurity: No Food Insecurity (11/29/2022)   Received from [facility], [facility]   Hunger Vital Sign    Worried About Running Out of Food in the Last Year: Never true    Ran Out of Food in the Last Year: Never true  Transportation Needs: No Transportation Needs (11/29/2022)   Received from [facility], [facility]   PRAPARE - Transportation    Lack of Transportation (Medical): No    Lack of Transportation (Non-Medical): No  Physical Activity: Not on file  Stress: Not on file  Social Connections: Unknown (10/16/2022)   Received from [facility], [facility]   Social Network    Social Network: Not on file  Intimate Partner Violence: Unknown (10/16/2022)   Received from [facility], [facility]   HITS    Physically Hurt: Not on file    Insult or Talk Down To: Not on file    Threaten Physical Harm: Not on file    Scream or Curse: Not on file    Past Medical History, Surgical history, Social history, and Family history were reviewed and updated as appropriate.   Please see review of systems for further details on the patient's review from today.   Objective:   Physical Exam:  There were no vitals taken for this visit.  Physical Exam Constitutional:      General: He is not in acute distress. Musculoskeletal:        General: No deformity.  Neurological:     Mental Status: He is alert and  oriented to person, place, and time.     Coordination: Coordination normal.  Psychiatric:        Attention and Perception: Attention and perception normal. He does not perceive auditory or visual hallucinations.        Mood and Affect: Affect is not labile, blunt, angry or inappropriate.        Speech: Speech normal.        Behavior: Behavior normal.        Thought Content: Thought content normal. Thought content is not paranoid or delusional. Thought content does not include homicidal or suicidal ideation. Thought content does not include homicidal or suicidal plan.        Cognition and Memory: Cognition and memory normal.        Judgment: Judgment normal.     Comments: Insight intact     Lab Review:  No results found for: "NA", "K", "CL", "CO2", "GLUCOSE", "BUN", "CREATININE", "  CALCIUM", "PROT", "ALBUMIN", "AST", "ALT", "ALKPHOS", "BILITOT", "GFRNONAA", "GFRAA"  No results found for: "WBC", "RBC", "HGB", "HCT", "PLT", "MCV", "MCH", "MCHC", "RDW", "LYMPHSABS", "MONOABS", "EOSABS", "BASOSABS"  No results found for: "POCLITH", "LITHIUM"   No results found for: "PHENYTOIN", "PHENOBARB", "VALPROATE", "CBMZ"   .res Assessment: Plan:    Plan:  PDMP reviewed  Adderall 20mg  - 1/2 tab BID Adderall XR 20mg  every morning.   Working with a functional medicine provider. Started on Xanax yesterday.   [facility] 43/58 - ADHD likely - previous diagnosis on file.  Monitor BP between visits while taking stimulant medication.   RTC 3 months  Patient advised to contact office with any questions, adverse effects, or acute worsening in signs and symptoms.  Discussed potential benefits, risks, and side effects of stimulants with patient to include increased heart rate, palpitations, insomnia, increased anxiety, increased irritability, or decreased appetite.  Instructed patient to contact office if experiencing any significant tolerability issues.  There are no diagnoses linked to this  encounter.   Please see After Visit Summary for patient specific instructions.  Future Appointments  Date Time Provider Department Center  05/17/2023 11:40 AM Dezyrae Kensinger, Thereasa Solo, NP [facility] None    No orders of the defined types were placed in this encounter.     -------------------------------

## 2023-08-19 ENCOUNTER — Telehealth: Payer: BC Managed Care – PPO | Admitting: Adult Health

## 2023-08-19 ENCOUNTER — Encounter: Payer: Self-pay | Admitting: Adult Health

## 2023-08-19 DIAGNOSIS — F900 Attention-deficit hyperactivity disorder, predominantly inattentive type: Secondary | ICD-10-CM

## 2023-08-19 DIAGNOSIS — F411 Generalized anxiety disorder: Secondary | ICD-10-CM

## 2023-08-19 DIAGNOSIS — F909 Attention-deficit hyperactivity disorder, unspecified type: Secondary | ICD-10-CM

## 2023-08-19 DIAGNOSIS — F4311 Post-traumatic stress disorder, acute: Secondary | ICD-10-CM

## 2023-08-19 DIAGNOSIS — F422 Mixed obsessional thoughts and acts: Secondary | ICD-10-CM

## 2023-08-19 DIAGNOSIS — F4310 Post-traumatic stress disorder, unspecified: Secondary | ICD-10-CM | POA: Diagnosis not present

## 2023-08-19 MED ORDER — AMPHETAMINE-DEXTROAMPHETAMINE 20 MG PO TABS: 20.0000 mg | ORAL_TABLET | Freq: Every day | ORAL | 0 refills | Status: DC

## 2023-08-19 MED ORDER — AMPHETAMINE-DEXTROAMPHET ER 20 MG PO CP24
20.0000 mg | ORAL_CAPSULE | Freq: Every day | ORAL | 0 refills | Status: DC
Start: 2023-08-19 — End: 2024-04-21

## 2023-08-19 MED ORDER — AMPHETAMINE-DEXTROAMPHET ER 20 MG PO CP24: 20.0000 mg | ORAL_CAPSULE | Freq: Every day | ORAL | 0 refills | Status: DC

## 2023-08-19 MED ORDER — AMPHETAMINE-DEXTROAMPHETAMINE 20 MG PO TABS
20.0000 mg | ORAL_TABLET | Freq: Every day | ORAL | 0 refills | Status: DC
Start: 2023-10-14 — End: 2024-04-21

## 2023-08-19 NOTE — Progress Notes (Signed)
Johnny Hubbard [id number] 2000/12/23 23 y.o.  Virtual Visit via Video Note  I connected with pt @ on 08/19/23 at  8:30 AM EST by a video enabled telemedicine application and verified that I am speaking with the correct person using two identifiers.   I discussed the limitations of evaluation and management by telemedicine and the availability of in person appointments. The patient expressed understanding and agreed to proceed.  I discussed the assessment and treatment plan with the patient. The patient was provided an opportunity to ask questions and all were answered. The patient agreed with the plan and demonstrated an understanding of the instructions.   The patient was advised to call back or seek an in-person evaluation if the symptoms worsen or if the condition fails to improve as anticipated.  I provided 25 minutes of non-face-to-face time during this encounter.  The patient was located at home.  The provider was located at [facility].   Dorothyann Gibbs, NP   Subjective:   Patient ID:  Johnny Hubbard is a 23 y.o. (DOB July 11, 2001) male.  Chief Complaint: No chief complaint on file.   HPI Johnny Hubbard presents for follow-up of GAD, ADHD, PTSD, Obsessional thoughts and acts.  Describes mood today as "ok". Pleasant. Denies tearfulness. Mood symptoms - denies depression and irritability. Denies anxiety - "a healthy amount - nothing excessive". Denies panic attacks. Denies worry, rumination, and over thinking. Denies obsessive thoughts or acts. Reports mood as stable. Stating "I feel like I'm doing pretty good". Feels like current medications are helpful - taking Adderall only. Stable interest and motivation. Taking medications as prescribed.  Energy levels "ok". Active, has a regular exercise routine.  Enjoys some usual interests and activities. Single. Lives with roommates. Parents divorced. Lives with mother when home - attends [employer]. Spending time with  family. Appetite adequate. Weight stable - 163 pounds. Sleeps well most nights. Averages 6 to 7 hours. Reports some daytime napping. Focus and concentration improved with addition of Adderall. Completing tasks. Managing aspects of household. Attends [employer] - junior. Plans to attend Vet school at [employer]. Denies SI or HI.  Denies AH or VH. Denies self harm. Denies substance use.   Previous medication trials: Zoloft, Wellbutrin   Review of Systems:  Review of Systems  Musculoskeletal:  Negative for gait problem.  Neurological:  Negative for tremors.  Psychiatric/Behavioral:         Please refer to HPI    Medications: I have reviewed the patient's current medications.  Current Outpatient Medications  Medication Sig Dispense Refill   amphetamine-dextroamphetamine (ADDERALL XR) 20 MG 24 hr capsule Take 1 capsule (20 mg total) by mouth daily. 30 capsule 0   amphetamine-dextroamphetamine (ADDERALL XR) 20 MG 24 hr capsule Take 1 capsule (20 mg total) by mouth daily. 30 capsule 0   amphetamine-dextroamphetamine (ADDERALL XR) 20 MG 24 hr capsule Take 1 capsule (20 mg total) by mouth daily. 30 capsule 0   amphetamine-dextroamphetamine (ADDERALL) 20 MG tablet Take 1 tablet (20 mg total) by mouth daily. 30 tablet 0   amphetamine-dextroamphetamine (ADDERALL) 20 MG tablet Take 1 tablet (20 mg total) by mouth daily. 30 tablet 0   amphetamine-dextroamphetamine (ADDERALL) 20 MG tablet Take 1 tablet (20 mg total) by mouth daily. 30 tablet 0   penicillin v potassium (VEETID) 500 MG tablet Take 1 tablet (500 mg total) by mouth 2 (two) times daily. 20 tablet 0   No current facility-administered medications for this visit.  Medication Side Effects: None  Allergies: No Known Allergies  Past Medical History:  Diagnosis Date   Anxiety    Asthma     No family history on file.  Social History   Socioeconomic History   Marital status: Single    Spouse name: Not on file   Number of  children: Not on file   Years of education: Not on file   Highest education level: Not on file  Occupational History   Not on file  Tobacco Use   Smoking status: Never   Smokeless tobacco: Never  Vaping Use   Vaping status: Never Used  Substance and Sexual Activity   Alcohol use: Never    Alcohol/week: 0.0 standard drinks of alcohol   Drug use: Never   Sexual activity: Never  Other Topics Concern   Not on file  Social History Narrative   Not on file   Social Drivers of Health   Financial Resource Strain: Low Risk  (11/29/2022)   Received from [facility], [facility]   Overall Financial Resource Strain (CARDIA)    Difficulty of Paying Living Expenses: Not hard at all  Food Insecurity: No Food Insecurity (11/29/2022)   [facility] from [facility], [facility]   Hunger Vital Sign    Worried About Running Out of Food in the Last Year: Never true    Ran Out of Food in the Last Year: Never true  Transportation Needs: No Transportation Needs (11/29/2022)   Received from [facility], [facility]   PRAPARE - Transportation    Lack of Transportation (Medical): No    Lack of Transportation (Non-Medical): No  Physical Activity: Not on file  Stress: Not on file  Social Connections: Unknown (10/16/2022)   Received from [facility], [facility]   Social Network    Social Network: Not on file  Intimate Partner Violence: Unknown (10/16/2022)   Received from [facility], [facility]   HITS    Physically Hurt: Not on file    Insult or Talk Down To: Not on file    Threaten Physical Harm: Not on file    Scream or Curse: Not on file    Past Medical History, Surgical history, Social history, and Family history were reviewed and updated as appropriate.   Please see review of systems for further details on the patient's review from today.   Objective:   Physical Exam:  There were no vitals taken for this visit.  Physical Exam Constitutional:      General: He is not in  acute distress. Musculoskeletal:        General: No deformity.  Neurological:     Mental Status: He is alert and oriented to person, place, and time.     Coordination: Coordination normal.  Psychiatric:        Attention and Perception: Attention and perception normal. He does not perceive auditory or visual hallucinations.        Mood and Affect: Mood normal. Mood is not anxious or depressed. Affect is not labile, blunt, angry or inappropriate.        Speech: Speech normal.        Behavior: Behavior normal.        Thought Content: Thought content normal. Thought content is not paranoid or delusional. Thought content does not include homicidal or suicidal ideation. Thought content does not include homicidal or suicidal plan.        Cognition and Memory: Cognition and memory normal.  Judgment: Judgment normal.     Comments: Insight intact     Lab Review:  No results found for: "NA", "K", "CL", "CO2", "GLUCOSE", "BUN", "CREATININE", "CALCIUM", "PROT", "ALBUMIN", "AST", "ALT", "ALKPHOS", "BILITOT", "GFRNONAA", "GFRAA"  No results found for: "WBC", "RBC", "HGB", "HCT", "PLT", "MCV", "MCH", "MCHC", "RDW", "LYMPHSABS", "MONOABS", "EOSABS", "BASOSABS"  No results found for: "POCLITH", "LITHIUM"   No results found for: "PHENYTOIN", "PHENOBARB", "VALPROATE", "CBMZ"   .res Assessment: Plan:   Plan:  PDMP reviewed  Adderall 20mg  - 1/2 tab BID Adderall XR 20mg  every morning.   Working with a functional medicine provider. Started on Xanax yesterday.   [facility] 43/58 - ADHD likely - previous diagnosis on file.  Monitor BP between visits while taking stimulant medication.   RTC 3 months  Patient advised to contact office with any questions, adverse effects, or acute worsening in signs and symptoms.  Discussed potential benefits, risks, and side effects of stimulants with patient to include increased heart rate, palpitations, insomnia, increased anxiety, increased  irritability, or decreased appetite.  Instructed patient to contact office if experiencing any significant tolerability issues.   There are no diagnoses linked to this encounter.   Please see After Visit Summary for patient specific instructions.  Future Appointments  Date Time Provider Department Center  08/19/2023  8:30 AM Andrez Lieurance, Thereasa Solo, NP CP-CP None    No orders of the defined types were placed in this encounter.     -------------------------------

## 2024-04-06 ENCOUNTER — Telehealth: Payer: Self-pay | Admitting: Adult Health

## 2024-04-06 ENCOUNTER — Other Ambulatory Visit: Payer: Self-pay

## 2024-04-06 DIAGNOSIS — F900 Attention-deficit hyperactivity disorder, predominantly inattentive type: Secondary | ICD-10-CM

## 2024-04-06 MED ORDER — AMPHETAMINE-DEXTROAMPHETAMINE 20 MG PO TABS: 20.0000 mg | ORAL_TABLET | Freq: Every day | ORAL | 0 refills | Status: DC

## 2024-04-06 NOTE — Telephone Encounter (Signed)
 Pended enough until appt.

## 2024-04-06 NOTE — Telephone Encounter (Signed)
 Pt called at 10:46a requesting refill of Adderall IR 20mg  to   [facility] [place], [place] - [address] [place] [address] Phone: [phone]  Fax: [phone]   Next appt 9/23

## 2024-04-21 ENCOUNTER — Telehealth (INDEPENDENT_AMBULATORY_CARE_PROVIDER_SITE_OTHER): Admitting: Adult Health

## 2024-04-21 ENCOUNTER — Encounter: Payer: Self-pay | Admitting: Adult Health

## 2024-04-21 DIAGNOSIS — F422 Mixed obsessional thoughts and acts: Secondary | ICD-10-CM

## 2024-04-21 DIAGNOSIS — F909 Attention-deficit hyperactivity disorder, unspecified type: Secondary | ICD-10-CM | POA: Diagnosis not present

## 2024-04-21 DIAGNOSIS — F411 Generalized anxiety disorder: Secondary | ICD-10-CM

## 2024-04-21 DIAGNOSIS — F900 Attention-deficit hyperactivity disorder, predominantly inattentive type: Secondary | ICD-10-CM

## 2024-04-21 DIAGNOSIS — F4311 Post-traumatic stress disorder, acute: Secondary | ICD-10-CM | POA: Diagnosis not present

## 2024-04-21 MED ORDER — AMPHETAMINE-DEXTROAMPHETAMINE 20 MG PO TABS: 20.0000 mg | ORAL_TABLET | Freq: Every day | ORAL | 0 refills | Status: DC

## 2024-04-21 MED ORDER — AMPHETAMINE-DEXTROAMPHET ER 20 MG PO CP24: 20.0000 mg | ORAL_CAPSULE | Freq: Every day | ORAL | 0 refills | Status: DC

## 2024-04-21 NOTE — Progress Notes (Signed)
 Johnny Hubbard [id number] [birth year] 23 y.o.  Virtual Visit via Video Note  I connected with pt @ on 04/21/24 at  8:00 AM EDT by a video enabled telemedicine application and verified that I am speaking with the correct person using two identifiers.   I discussed the limitations of evaluation and management by telemedicine and the availability of in person appointments. The patient expressed understanding and agreed to proceed.  I discussed the assessment and treatment plan with the patient. The patient was provided an opportunity to ask questions and all were answered. The patient agreed with the plan and demonstrated an understanding of the instructions.   The patient was advised to call back or seek an in-person evaluation if the symptoms worsen or if the condition fails to improve as anticipated.  I provided 25 minutes of non-face-to-face time during this encounter.  The patient was located at home.  The provider was located at [facility].   Angeline LOISE Sayers, NP   Subjective:   Patient ID:  Johnny Hubbard is a 23 y.o. (DOB [birth year]) male.  Chief Complaint: No chief complaint on file.   HPI Khali LONGINO TREFZ presents for follow-up of GAD, ADHD, PTSD, Obsessional thoughts and acts.  Describes mood today as ok. Pleasant. Denies tearfulness. Mood symptoms - denies anxiety, depression and irritability. Reports stable interest and motivation. Denies panic attacks. Denies worry, rumination, and over thinking. Denies obsessive thoughts or acts. Reports mood as stable. Stating I feel like I'm doing ok. Feels like current medications are helpful. Taking medications as prescribed.  Energy levels stable. Active, has a regular exercise routine.  Enjoys some usual interests and activities. Single. Lives with roommates. Parents divorced. Lives with mother when home - attends [employer]. Spending time with family. Appetite adequate. Weight stable - 165 pounds. Sleeps well most  nights. Averages 6 hours during the week and 8 to 9 hours on the weekends. Reports some daytime napping. Focus and concentration stable - taking Adderall. Completing tasks. Managing aspects of household. Attends [employer] - senior. Plans to attend Vet school at [employer]. Denies SI or HI.  Denies AH or VH. Denies self harm. Denies substance use.   Previous medication trials: Zoloft , Wellbutrin    Review of Systems:  Review of Systems  Musculoskeletal:  Negative for gait problem.  Neurological:  Negative for tremors.  Psychiatric/Behavioral:         Please refer to HPI    Medications: I have reviewed the patient's current medications.  Current Outpatient Medications  Medication Sig Dispense Refill   amphetamine -dextroamphetamine  (ADDERALL XR) 20 MG 24 hr capsule Take 1 capsule (20 mg total) by mouth daily. 30 capsule 0   amphetamine -dextroamphetamine  (ADDERALL XR) 20 MG 24 hr capsule Take 1 capsule (20 mg total) by mouth daily. 30 capsule 0   amphetamine -dextroamphetamine  (ADDERALL XR) 20 MG 24 hr capsule Take 1 capsule (20 mg total) by mouth daily. 30 capsule 0   amphetamine -dextroamphetamine  (ADDERALL) 20 MG tablet Take 1 tablet (20 mg total) by mouth daily. 30 tablet 0   amphetamine -dextroamphetamine  (ADDERALL) 20 MG tablet Take 1 tablet (20 mg total) by mouth daily. 30 tablet 0   amphetamine -dextroamphetamine  (ADDERALL) 20 MG tablet Take 1 tablet (20 mg total) by mouth daily. 19 tablet 0   penicillin  v potassium (VEETID) 500 MG tablet Take 1 tablet (500 mg total) by mouth 2 (two) times daily. 20 tablet 0   No current facility-administered medications for this visit.    Medication Side Effects:  None  Allergies: No Known Allergies  Past Medical History:  Diagnosis Date   Anxiety    Asthma     No family history on file.  Social History   Socioeconomic History   Marital status: Single    Spouse name: Not on file   Number of children: Not on file   Years of education:  Not on file   Highest education level: Not on file  Occupational History   Not on file  Tobacco Use   Smoking status: Never   Smokeless tobacco: Never  Vaping Use   Vaping status: Never Used  Substance and Sexual Activity   Alcohol use: Never    Alcohol/week: 0.0 standard drinks of alcohol   Drug use: Never   Sexual activity: Never  Other Topics Concern   Not on file  Social History Narrative   Not on file   Social Drivers of Health   Financial Resource Strain: Low Risk  (11/29/2022)   Received from [facility]   Overall Financial Resource Strain (CARDIA)    Difficulty of Paying Living Expenses: Not hard at all  Food Insecurity: No Food Insecurity (11/29/2022)   Received from [facility]   Hunger Vital Sign    Within the past 12 months, you worried that your food would run out before you got the money to buy more.: Never true    Within the past 12 months, the food you bought just didn't last and you didn't have money to get more.: Never true  Transportation Needs: No Transportation Needs (11/29/2022)   Received from [facility] - Transportation    Lack of Transportation (Medical): No    Lack of Transportation (Non-Medical): No  Physical Activity: Not on file  Stress: Not on file  Social Connections: Unknown (10/16/2022)   Received from [facility]   Social Network    Social Network: Not on file  Intimate Partner Violence: Unknown (10/16/2022)   Received from [facility]   HITS    Physically Hurt: Not on file    Insult or Talk Down To: Not on file    Threaten Physical Harm: Not on file    Scream or Curse: Not on file    Past Medical History, Surgical history, Social history, and Family history were reviewed and updated as appropriate.   Please see review of systems for further details on the patient's review from today.   Objective:   Physical Exam:  There were no vitals taken for this visit.  Physical Exam Constitutional:      General: He is  not in acute distress. Musculoskeletal:        General: No deformity.  Neurological:     Mental Status: He is alert and oriented to person, place, and time.     Coordination: Coordination normal.  Psychiatric:        Attention and Perception: Attention and perception normal. He does not perceive auditory or visual hallucinations.        Mood and Affect: Mood normal. Mood is not anxious or depressed. Affect is not labile, blunt, angry or inappropriate.        Speech: Speech normal.        Behavior: Behavior normal.        Thought Content: Thought content normal. Thought content is not paranoid or delusional. Thought content does not include homicidal or suicidal ideation. Thought content does not include homicidal or suicidal plan.        Cognition and  Memory: Cognition and memory normal.        Judgment: Judgment normal.     Comments: Insight intact     Lab Review:  No results found for: NA, K, CL, CO2, GLUCOSE, BUN, CREATININE, CALCIUM, PROT, ALBUMIN, AST, ALT, ALKPHOS, BILITOT, GFRNONAA, GFRAA  No results found for: WBC, RBC, HGB, HCT, PLT, MCV, MCH, MCHC, RDW, LYMPHSABS, MONOABS, EOSABS, BASOSABS  No results found for: POCLITH, LITHIUM   No results found for: PHENYTOIN, PHENOBARB, VALPROATE, CBMZ   .res Assessment: Plan:    Plan:  PDMP reviewed  Adderall 20mg  - 1/2 tab BID Adderall XR 20mg  every morning.   Working with a functional medicine provider - prescribing Xanax.  [facility] 43/58 - ADHD likely - previous diagnosis on file.  Monitor BP between visits while taking stimulant medication.   RTC 3 months  15 minutes spent dedicated to the care of this patient on the date of this encounter to include pre-visit review of records, ordering of medication, post visit documentation, and face-to-face time with the patient discussing GAD, ADHD, PTSD, Obsessional thoughts and acts. Discussed continuing  current medication regimen.  Patient advised to contact office with any questions, adverse effects, or acute worsening in signs and symptoms.  Discussed potential benefits, risks, and side effects of stimulants with patient to include increased heart rate, palpitations, insomnia, increased anxiety, increased irritability, or decreased appetite.  Instructed patient to contact office if experiencing any significant tolerability issues.   There are no diagnoses linked to this encounter.   Please see After Visit Summary for patient specific instructions.  Future Appointments  Date Time Provider Department Center  04/21/2024  8:00 AM Aqeel Norgaard Nattalie, NP [facility] None    No orders of the defined types were placed in this encounter.     -------------------------------

## 2024-07-20 ENCOUNTER — Telehealth: Admitting: Adult Health

## 2024-07-20 ENCOUNTER — Encounter: Payer: Self-pay | Admitting: Adult Health

## 2024-07-20 DIAGNOSIS — F422 Mixed obsessional thoughts and acts: Secondary | ICD-10-CM

## 2024-07-20 DIAGNOSIS — F4310 Post-traumatic stress disorder, unspecified: Secondary | ICD-10-CM

## 2024-07-20 DIAGNOSIS — F4311 Post-traumatic stress disorder, acute: Secondary | ICD-10-CM

## 2024-07-20 DIAGNOSIS — F411 Generalized anxiety disorder: Secondary | ICD-10-CM | POA: Diagnosis not present

## 2024-07-20 DIAGNOSIS — F909 Attention-deficit hyperactivity disorder, unspecified type: Secondary | ICD-10-CM

## 2024-07-20 DIAGNOSIS — F900 Attention-deficit hyperactivity disorder, predominantly inattentive type: Secondary | ICD-10-CM

## 2024-07-20 MED ORDER — AMPHETAMINE-DEXTROAMPHETAMINE 20 MG PO TABS: 20.0000 mg | ORAL_TABLET | Freq: Every day | ORAL | 0 refills | Status: AC

## 2024-07-20 MED ORDER — AMPHETAMINE-DEXTROAMPHET ER 20 MG PO CP24: 20.0000 mg | ORAL_CAPSULE | Freq: Every day | ORAL | 0 refills | Status: AC

## 2024-07-20 NOTE — Progress Notes (Signed)
 Johnny Hubbard [id number] 07-Jul-2001 23 y.o.  Virtual Visit via Video Note  I connected with pt @ on 07/20/2024 at  8:00 AM EST by a video enabled telemedicine application and verified that I am speaking with the correct person using two identifiers.   I discussed the limitations of evaluation and management by telemedicine and the availability of in person appointments. The patient expressed understanding and agreed to proceed.  I discussed the assessment and treatment plan with the patient. The patient was provided an opportunity to ask questions and all were answered. The patient agreed with the plan and demonstrated an understanding of the instructions.   The patient was advised to call back or seek an in-person evaluation if the symptoms worsen or if the condition fails to improve as anticipated.  I provided 20 minutes of non-face-to-face time during this encounter.  The patient was located at home.  The provider was located at [facility].   Angeline LOISE Sayers, NP   Subjective:   Patient ID:  Johnny Hubbard is a 23 y.o. (DOB [birth year]) male.  Chief Complaint: No chief complaint on file.   HPI Johnny Hubbard presents for follow-up of GAD, ADHD, PTSD, Obsessional thoughts and acts.  Describes mood today as ok. Pleasant. Denies tearfulness. Mood symptoms - denies anxiety, depression and irritability. Reports stable interest and motivation. Denies panic attacks. Denies worry, rumination, and over thinking. Denies obsessive thoughts or acts. Reports mood as stable. Stating I feel like I'm doing alright. Feels like current medications are helpful. Taking medications as prescribed.  Energy levels stable. Active, has a regular exercise routine.  Enjoys some usual interests and activities. Single. Lives with roommates. Parents divorced. Lives with mother when home - attends [employer]. Spending time with family. Appetite adequate. Weight stable - 165 pounds. Sleeps  well most nights. Averages 8 hours. Reports some daytime napping. Focus and concentration stable - taking Adderall. Completing tasks. Managing aspects of household. Attends [employer] - senior. Plans to attend Vet school at [employer] - awaiting a decision. Denies SI or HI.  Denies AH or VH. Denies self harm. Denies substance use.   Previous medication trials: Zoloft , Wellbutrin    Review of Systems:  Review of Systems  Musculoskeletal:  Negative for gait problem.  Neurological:  Negative for tremors.  Psychiatric/Behavioral:         Please refer to HPI    Medications: I have reviewed the patient's current medications.  Current Outpatient Medications  Medication Sig Dispense Refill   amphetamine -dextroamphetamine  (ADDERALL XR) 20 MG 24 hr capsule Take 1 capsule (20 mg total) by mouth daily. 30 capsule 0   amphetamine -dextroamphetamine  (ADDERALL XR) 20 MG 24 hr capsule Take 1 capsule (20 mg total) by mouth daily. 30 capsule 0   amphetamine -dextroamphetamine  (ADDERALL XR) 20 MG 24 hr capsule Take 1 capsule (20 mg total) by mouth daily. 30 capsule 0   amphetamine -dextroamphetamine  (ADDERALL) 20 MG tablet Take 1 tablet (20 mg total) by mouth daily. 30 tablet 0   amphetamine -dextroamphetamine  (ADDERALL) 20 MG tablet Take 1 tablet (20 mg total) by mouth daily. 30 tablet 0   amphetamine -dextroamphetamine  (ADDERALL) 20 MG tablet Take 1 tablet (20 mg total) by mouth daily. 30 tablet 0   penicillin  v potassium (VEETID) 500 MG tablet Take 1 tablet (500 mg total) by mouth 2 (two) times daily. 20 tablet 0   No current facility-administered medications for this visit.    Medication Side Effects: None  Allergies: Allergies[1]  Past Medical  History:  Diagnosis Date   Anxiety    Asthma     No family history on file.  Social History   Socioeconomic History   Marital status: Single    Spouse name: Not on file   Number of children: Not on file   Years of education: Not on file   Highest  education level: Not on file  Occupational History   Not on file  Tobacco Use   Smoking status: Never   Smokeless tobacco: Never  Vaping Use   Vaping status: Never Used  Substance and Sexual Activity   Alcohol use: Never    Alcohol/week: 0.0 standard drinks of alcohol   Drug use: Never   Sexual activity: Never  Other Topics Concern   Not on file  Social History Narrative   Not on file   Social Drivers of Health   Tobacco Use: Low Risk (04/21/2024)   Patient History    Smoking Tobacco Use: Never    Smokeless Tobacco Use: Never    Passive Exposure: Not on file  Financial Resource Strain: Low Risk (11/29/2022)   Received from [facility]   Overall Financial Resource Strain (CARDIA)    Difficulty of Paying Living Expenses: Not hard at all  Food Insecurity: No Food Insecurity (11/29/2022)   Received from [facility]   Epic    Within the past 12 months, you worried that your food would run out before you got the money to buy more.: Never true    Within the past 12 months, the food you bought just didn't last and you didn't have money to get more.: Never true  Transportation Needs: No Transportation Needs (11/29/2022)   Received from [facility] - Transportation    Lack of Transportation (Medical): No    Lack of Transportation (Non-Medical): No  Physical Activity: Not on file  Stress: Not on file  Social Connections: Unknown (10/16/2022)   Received [facility]   Social Network    Social Network: Not on file  Intimate Partner Violence: Unknown (10/16/2022)   Received from [facility]   HITS    Physically Hurt: Not on file    Insult or Talk Down To: Not on file    Threaten Physical Harm: Not on file    Scream or Curse: Not on file  Depression (EYV7-0): Not on file  Alcohol Screen: Not on file  Housing: Not on file  Utilities: Not At Risk (11/29/2022)   Received from [facility]    Threatened with loss of utilities: No  Health Literacy:  Not on file    Past Medical History, Surgical history, Social history, and Family history were reviewed and updated as appropriate.   Please see review of systems for further details on the patient's review from today.   Objective:   Physical Exam:  There were no vitals taken for this visit.  Physical Exam Constitutional:      General: He is not in acute distress. Musculoskeletal:        General: No deformity.  Neurological:     Mental Status: He is alert and oriented to person, place, and time.     Coordination: Coordination normal.  Psychiatric:        Attention and Perception: Attention and perception normal. He does not perceive auditory or visual hallucinations.        Mood and Affect: Mood normal. Mood is not anxious or depressed. Affect is not labile, blunt, angry  or inappropriate.        Speech: Speech normal.        Behavior: Behavior normal.        Thought Content: Thought content normal. Thought content is not paranoid or delusional. Thought content does not include homicidal or suicidal ideation. Thought content does not include homicidal or suicidal plan.        Cognition and Memory: Cognition and memory normal.        Judgment: Judgment normal.     Comments: Insight intact     Lab Review:  No results found for: NA, K, CL, CO2, GLUCOSE, BUN, CREATININE, CALCIUM, PROT, ALBUMIN, AST, ALT, ALKPHOS, BILITOT, GFRNONAA, GFRAA  No results found for: WBC, RBC, HGB, HCT, PLT, MCV, MCH, MCHC, RDW, LYMPHSABS, MONOABS, EOSABS, BASOSABS  No results found for: POCLITH, LITHIUM   No results found for: PHENYTOIN, PHENOBARB, VALPROATE, CBMZ   .res Assessment: Plan:    Plan:  PDMP reviewed  Adderall 20mg  - 1/2 tab BID Adderall XR 20mg  every morning.   Working with a functional medicine provider - prescribing Xanax.  [facility] 43/58 - ADHD likely - previous diagnosis on file.  Monitor BP between  visits while taking stimulant medication.   RTC 3 months  20 minutes spent dedicated to the care of this patient on the date of this encounter to include pre-visit review of records, ordering of medication, post visit documentation, and face-to-face time with the patient discussing GAD, ADHD, PTSD, and obsessional thoughts and acts. Discussed continuing current medication regimen.  Patient advised to contact office with any questions, adverse effects, or acute worsening in signs and symptoms.  Discussed potential benefits, risks, and side effects of stimulants with patient to include increased heart rate, palpitations, insomnia, increased anxiety, increased irritability, or decreased appetite.  Instructed patient to contact office if experiencing any significant tolerability issues.   There are no diagnoses linked to this encounter.   Please see After Visit Summary for patient specific instructions.  Future Appointments  Date Time Provider Department Center  07/20/2024  8:00 AM Marshay Slates Nattalie, NP [facility] None    No orders of the defined types were placed in this encounter.     -------------------------------      [1] No Known Allergies

## 2024-10-23 ENCOUNTER — Telehealth: Admitting: Adult Health
